# Patient Record
Sex: Female | Born: 1960 | Race: White | Hispanic: No | Marital: Married | Smoking: Former smoker
Health system: Southern US, Community
[De-identification: ages and names within clinical notes are randomized; demographics above are authoritative.]

## PROBLEM LIST (undated history)

## (undated) DIAGNOSIS — F419 Anxiety disorder, unspecified: Secondary | ICD-10-CM

## (undated) DIAGNOSIS — I10 Essential (primary) hypertension: Secondary | ICD-10-CM

## (undated) DIAGNOSIS — E785 Hyperlipidemia, unspecified: Secondary | ICD-10-CM

## (undated) DIAGNOSIS — I129 Hypertensive chronic kidney disease with stage 1 through stage 4 chronic kidney disease, or unspecified chronic kidney disease: Secondary | ICD-10-CM

## (undated) DIAGNOSIS — N049 Nephrotic syndrome with unspecified morphologic changes: Secondary | ICD-10-CM

## (undated) DIAGNOSIS — F32A Depression, unspecified: Secondary | ICD-10-CM

## (undated) DIAGNOSIS — N289 Disorder of kidney and ureter, unspecified: Secondary | ICD-10-CM

## (undated) DIAGNOSIS — F329 Major depressive disorder, single episode, unspecified: Secondary | ICD-10-CM

## (undated) HISTORY — DX: Nephrotic syndrome with unspecified morphologic changes: N04.9

## (undated) HISTORY — DX: Hypertensive chronic kidney disease with stage 1 through stage 4 chronic kidney disease, or unspecified chronic kidney disease: I12.9

## (undated) HISTORY — DX: Anxiety disorder, unspecified: F41.9

## (undated) HISTORY — DX: Depression, unspecified: F32.A

## (undated) HISTORY — DX: Major depressive disorder, single episode, unspecified: F32.9

## (undated) HISTORY — DX: Hyperlipidemia, unspecified: E78.5

---

## 1991-07-06 HISTORY — PX: TUBAL LIGATION: SHX77

## 2001-04-19 ENCOUNTER — Emergency Department (HOSPITAL_COMMUNITY): Admission: EM | Admit: 2001-04-19 | Discharge: 2001-04-19 | Payer: Self-pay | Admitting: Emergency Medicine

## 2001-07-19 ENCOUNTER — Ambulatory Visit (HOSPITAL_COMMUNITY): Admission: RE | Admit: 2001-07-19 | Discharge: 2001-07-19 | Payer: Self-pay | Admitting: Specialist

## 2001-07-19 ENCOUNTER — Encounter: Payer: Self-pay | Admitting: Specialist

## 2001-08-03 ENCOUNTER — Other Ambulatory Visit: Admission: RE | Admit: 2001-08-03 | Discharge: 2001-08-03 | Payer: Self-pay | Admitting: Obstetrics and Gynecology

## 2002-07-19 ENCOUNTER — Ambulatory Visit (HOSPITAL_COMMUNITY): Admission: RE | Admit: 2002-07-19 | Discharge: 2002-07-19 | Payer: Self-pay | Admitting: Specialist

## 2002-07-19 ENCOUNTER — Encounter: Payer: Self-pay | Admitting: Specialist

## 2003-08-08 ENCOUNTER — Ambulatory Visit (HOSPITAL_COMMUNITY): Admission: RE | Admit: 2003-08-08 | Discharge: 2003-08-08 | Payer: Self-pay | Admitting: Specialist

## 2004-09-28 ENCOUNTER — Ambulatory Visit (HOSPITAL_COMMUNITY): Admission: RE | Admit: 2004-09-28 | Discharge: 2004-09-28 | Payer: Self-pay | Admitting: Obstetrics and Gynecology

## 2004-10-02 ENCOUNTER — Ambulatory Visit (HOSPITAL_COMMUNITY): Admission: RE | Admit: 2004-10-02 | Discharge: 2004-10-02 | Payer: Self-pay | Admitting: Obstetrics and Gynecology

## 2005-10-04 ENCOUNTER — Ambulatory Visit (HOSPITAL_COMMUNITY): Admission: RE | Admit: 2005-10-04 | Discharge: 2005-10-04 | Payer: Self-pay | Admitting: Family Medicine

## 2007-02-06 ENCOUNTER — Ambulatory Visit (HOSPITAL_COMMUNITY): Admission: RE | Admit: 2007-02-06 | Discharge: 2007-02-06 | Payer: Self-pay | Admitting: Obstetrics and Gynecology

## 2007-05-18 ENCOUNTER — Other Ambulatory Visit: Admission: RE | Admit: 2007-05-18 | Discharge: 2007-05-18 | Payer: Self-pay | Admitting: Obstetrics and Gynecology

## 2008-09-04 ENCOUNTER — Other Ambulatory Visit: Admission: RE | Admit: 2008-09-04 | Discharge: 2008-09-04 | Payer: Self-pay | Admitting: Obstetrics and Gynecology

## 2009-11-12 ENCOUNTER — Other Ambulatory Visit: Admission: RE | Admit: 2009-11-12 | Discharge: 2009-11-12 | Payer: Self-pay | Admitting: Obstetrics and Gynecology

## 2010-02-09 ENCOUNTER — Ambulatory Visit (HOSPITAL_COMMUNITY): Admission: RE | Admit: 2010-02-09 | Discharge: 2010-02-09 | Payer: Self-pay | Admitting: Obstetrics & Gynecology

## 2010-02-21 ENCOUNTER — Emergency Department (HOSPITAL_COMMUNITY): Admission: EM | Admit: 2010-02-21 | Discharge: 2010-02-22 | Payer: Self-pay | Admitting: Emergency Medicine

## 2010-03-16 ENCOUNTER — Emergency Department (HOSPITAL_COMMUNITY): Admission: EM | Admit: 2010-03-16 | Discharge: 2010-03-17 | Payer: Self-pay | Admitting: Emergency Medicine

## 2010-03-27 ENCOUNTER — Ambulatory Visit (HOSPITAL_COMMUNITY): Admission: RE | Admit: 2010-03-27 | Discharge: 2010-03-27 | Payer: Self-pay | Admitting: Nephrology

## 2010-06-15 ENCOUNTER — Ambulatory Visit (HOSPITAL_COMMUNITY)
Admission: RE | Admit: 2010-06-15 | Discharge: 2010-06-15 | Payer: Self-pay | Source: Home / Self Care | Attending: Obstetrics & Gynecology | Admitting: Obstetrics & Gynecology

## 2010-07-25 ENCOUNTER — Encounter: Payer: Self-pay | Admitting: Specialist

## 2010-09-17 LAB — COMPREHENSIVE METABOLIC PANEL
ALT: 16 U/L (ref 0–35)
ALT: 12 U/L (ref 0–35)
AST: 21 U/L (ref 0–37)
Albumin: 2.3 g/dL — ABNORMAL LOW (ref 3.5–5.2)
Albumin: 2.1 g/dL — ABNORMAL LOW (ref 3.5–5.2)
Alkaline Phosphatase: 80 U/L (ref 39–117)
BUN: 18 mg/dL (ref 6–23)
CO2: 28 mEq/L (ref 19–32)
CO2: 28 mEq/L (ref 19–32)
Calcium: 8.5 mg/dL (ref 8.4–10.5)
Calcium: 8.9 mg/dL (ref 8.4–10.5)
Chloride: 106 mEq/L (ref 96–112)
Chloride: 107 mEq/L (ref 96–112)
Creatinine, Ser: 0.84 mg/dL (ref 0.4–1.2)
GFR calc Af Amer: >60 mL/min (ref >60)
GFR calc non Af Amer: >60 mL/min (ref >60)
GFR calc non Af Amer: >60 mL/min (ref >60)
Glucose, Bld: 95 mg/dL (ref 70–99)
Potassium: 4.1 mEq/L (ref 3.5–5.1)
Potassium: 3.9 mEq/L (ref 3.5–5.1)
Sodium: 139 mEq/L (ref 135–145)
Total Bilirubin: 0.4 mg/dL (ref 0.3–1.2)
Total Protein: 5.5 g/dL — ABNORMAL LOW (ref 6.0–8.3)
Total Protein: 5.2 g/dL — ABNORMAL LOW (ref 6.0–8.3)

## 2010-09-17 LAB — CBC
HCT: 41.6 % (ref 36.0–46.0)
HCT: 37 % (ref 36.0–46.0)
HCT: 36 % (ref 36.0–46.0)
Hemoglobin: 12.1 g/dL (ref 12.0–15.0)
MCH: 30.9 pg (ref 26.0–34.0)
MCH: 31 pg (ref 26.0–34.0)
MCHC: 33.6 g/dL (ref 30.0–36.0)
Platelets: 251 10*3/uL (ref 150–400)
RBC: 3.9 MIL/uL (ref 3.87–5.11)
RDW: 12.3 % (ref 11.5–15.5)
RDW: 12.5 % (ref 11.5–15.5)
WBC: 10.4 10*3/uL (ref 4.0–10.5)

## 2010-09-17 LAB — URINE CULTURE
Colony Count: NO GROWTH
Culture  Setup Time: 201108211315

## 2010-09-17 LAB — URINALYSIS, ROUTINE W REFLEX MICROSCOPIC
Bilirubin Urine: NEGATIVE
Bilirubin Urine: NEGATIVE
Glucose, UA: NEGATIVE mg/dL
Glucose, UA: NEGATIVE mg/dL
Ketones, ur: NEGATIVE mg/dL
Ketones, ur: NEGATIVE mg/dL
Nitrite: NEGATIVE
Protein, ur: >300 mg/dL — AB
Urobilinogen, UA: 0.2 mg/dL (ref 0.0–1.0)

## 2010-09-17 LAB — DIFFERENTIAL
Basophils Absolute: 0.1 10*3/uL (ref 0.0–0.1)
Eosinophils Absolute: 0.6 10*3/uL (ref 0.0–0.7)
Eosinophils Relative: 6 % — ABNORMAL HIGH (ref 0–5)
Monocytes Absolute: 0.7 10*3/uL (ref 0.1–1.0)
Neutro Abs: 5.9 10*3/uL (ref 1.7–7.7)

## 2010-09-17 LAB — PROTIME-INR: INR: 0.89 (ref 0.00–1.49)

## 2010-09-17 LAB — URINE MICROSCOPIC-ADD ON

## 2011-01-05 ENCOUNTER — Other Ambulatory Visit (HOSPITAL_COMMUNITY)
Admission: RE | Admit: 2011-01-05 | Discharge: 2011-01-05 | Disposition: A | Discharge status: Home / Self Care | Payer: Managed Care, Other (non HMO) | Source: Ambulatory Visit | Attending: Obstetrics and Gynecology | Admitting: Obstetrics and Gynecology

## 2011-01-05 ENCOUNTER — Other Ambulatory Visit: Payer: Self-pay | Admitting: Adult Health

## 2011-01-05 DIAGNOSIS — Z01419 Encounter for gynecological examination (general) (routine) without abnormal findings: Secondary | ICD-10-CM | POA: Insufficient documentation

## 2011-04-05 ENCOUNTER — Other Ambulatory Visit: Payer: Self-pay | Admitting: Adult Health

## 2011-04-05 DIAGNOSIS — Z139 Encounter for screening, unspecified: Secondary | ICD-10-CM

## 2011-06-21 ENCOUNTER — Ambulatory Visit (HOSPITAL_COMMUNITY): Admission: RE | Admit: 2011-06-21 | Payer: Managed Care, Other (non HMO) | Source: Ambulatory Visit

## 2011-07-02 ENCOUNTER — Emergency Department (HOSPITAL_COMMUNITY)
Admission: EM | Admit: 2011-07-02 | Discharge: 2011-07-02 | Disposition: A | Discharge status: Home / Self Care | Payer: Managed Care, Other (non HMO) | Attending: Emergency Medicine | Admitting: Emergency Medicine

## 2011-07-02 ENCOUNTER — Other Ambulatory Visit: Payer: Self-pay

## 2011-07-02 ENCOUNTER — Emergency Department (HOSPITAL_COMMUNITY): Payer: Managed Care, Other (non HMO)

## 2011-07-02 DIAGNOSIS — I129 Hypertensive chronic kidney disease with stage 1 through stage 4 chronic kidney disease, or unspecified chronic kidney disease: Secondary | ICD-10-CM | POA: Insufficient documentation

## 2011-07-02 DIAGNOSIS — N189 Chronic kidney disease, unspecified: Secondary | ICD-10-CM | POA: Insufficient documentation

## 2011-07-02 DIAGNOSIS — R509 Fever, unspecified: Secondary | ICD-10-CM | POA: Insufficient documentation

## 2011-07-02 DIAGNOSIS — Z79899 Other long term (current) drug therapy: Secondary | ICD-10-CM | POA: Insufficient documentation

## 2011-07-02 DIAGNOSIS — R112 Nausea with vomiting, unspecified: Secondary | ICD-10-CM | POA: Insufficient documentation

## 2011-07-02 DIAGNOSIS — R0602 Shortness of breath: Secondary | ICD-10-CM | POA: Insufficient documentation

## 2011-07-02 DIAGNOSIS — R0682 Tachypnea, not elsewhere classified: Secondary | ICD-10-CM | POA: Insufficient documentation

## 2011-07-02 DIAGNOSIS — J40 Bronchitis, not specified as acute or chronic: Secondary | ICD-10-CM

## 2011-07-02 DIAGNOSIS — Z87891 Personal history of nicotine dependence: Secondary | ICD-10-CM | POA: Insufficient documentation

## 2011-07-02 HISTORY — DX: Essential (primary) hypertension: I10

## 2011-07-02 HISTORY — DX: Disorder of kidney and ureter, unspecified: N28.9

## 2011-07-02 MED ORDER — ALBUTEROL SULFATE (5 MG/ML) 0.5% IN NEBU
5.0000 mg | INHALATION_SOLUTION | Freq: Once | RESPIRATORY_TRACT | Status: AC
  Administered 2011-07-02: 5 mg via RESPIRATORY_TRACT
  Filled 2011-07-02 (×2): qty 0.5

## 2011-07-02 MED ORDER — IPRATROPIUM BROMIDE 0.02 % IN SOLN
0.5000 mg | Freq: Once | RESPIRATORY_TRACT | Status: AC
  Administered 2011-07-02: 0.5 mg via RESPIRATORY_TRACT
  Filled 2011-07-02: qty 2.5

## 2011-07-02 MED ORDER — AEROCHAMBER PLUS W/MASK MISC
Status: AC
  Filled 2011-07-02: qty 1

## 2011-07-02 MED ORDER — PREDNISONE 20 MG PO TABS: 40.0000 mg | ORAL_TABLET | Freq: Every day | ORAL | Status: AC

## 2011-07-02 MED ORDER — PREDNISONE 20 MG PO TABS
60.0000 mg | ORAL_TABLET | Freq: Once | ORAL | Status: AC
  Administered 2011-07-02: 60 mg via ORAL
  Filled 2011-07-02: qty 3

## 2011-07-02 MED ORDER — ALBUTEROL SULFATE HFA 108 (90 BASE) MCG/ACT IN AERS
1.0000 | INHALATION_SPRAY | Freq: Four times a day (QID) | RESPIRATORY_TRACT | Status: DC | PRN
  Filled 2011-07-02: qty 6.7

## 2011-07-02 MED ORDER — ALBUTEROL SULFATE (5 MG/ML) 0.5% IN NEBU
5.0000 mg | INHALATION_SOLUTION | Freq: Once | RESPIRATORY_TRACT | Status: AC
  Administered 2011-07-02: 5 mg via RESPIRATORY_TRACT
  Filled 2011-07-02: qty 1

## 2011-07-02 NOTE — ED Notes (Signed)
EDP at bedside  

## 2011-07-02 NOTE — Discharge Instructions (Signed)
Bronchitis Bronchitis is the body's way of reacting to injury and/or infection (inflammation) of the bronchi. Bronchi are the air tubes that extend from the windpipe into the lungs. If the inflammation becomes severe, it may cause shortness of breath. CAUSES  Inflammation may be caused by:  A virus.   Germs (bacteria).   Dust.   Allergens.   Pollutants and many other irritants.  The cells lining the bronchial tree are covered with tiny hairs (cilia). These constantly beat upward, away from the lungs, toward the mouth. This keeps the lungs free of pollutants. When these cells become too irritated and are unable to do their job, mucus begins to develop. This causes the characteristic cough of bronchitis. The cough clears the lungs when the cilia are unable to do their job. Without either of these protective mechanisms, the mucus would settle in the lungs. Then you would develop pneumonia. Smoking is a common cause of bronchitis and can contribute to pneumonia. Stopping this habit is the single most important thing you can do to help yourself. TREATMENT   Your caregiver may prescribe an antibiotic if the cough is caused by bacteria. Also, medicines that open up your airways make it easier to breathe. Your caregiver may also recommend or prescribe an expectorant. It will loosen the mucus to be coughed up. Only take over-the-counter or prescription medicines for pain, discomfort, or fever as directed by your caregiver.   Removing whatever causes the problem (smoking, for example) is critical to preventing the problem from getting worse.   Cough suppressants may be prescribed for relief of cough symptoms.   Inhaled medicines may be prescribed to help with symptoms now and to help prevent problems from returning.   For those with recurrent (chronic) bronchitis, there may be a need for steroid medicines.  SEEK IMMEDIATE MEDICAL CARE IF:   During treatment, you develop more pus-like mucus  (purulent sputum).   You have a fever.   You become progressively more ill.   You have increased difficulty breathing, wheezing, or shortness of breath.  It is necessary to seek immediate medical care if you are elderly or sick from any other disease. MAKE SURE YOU:   Understand these instructions.   Will watch your condition.   Will get help right away if you are not doing well or get worse.  Document Released: 06/21/2005 Document Revised: 03/03/2011 Document Reviewed: 04/30/2008 Exit[employer] Patient Information 2012 [employer]. 

## 2011-07-02 NOTE — ED Notes (Signed)
Patient resting with NAD at this time. Patient states she feel better and is breathing better. Family at bedside.

## 2011-07-02 NOTE — ED Notes (Signed)
Patient states that she has had SOB x 2 days and non-productive coughing x 2 weeks.  Patient denies fever, n/v until arrival to ED and patient vomited on arrival.

## 2011-07-02 NOTE — ED Notes (Signed)
ekg given to dr. ghim 

## 2011-07-02 NOTE — ED Notes (Signed)
Patient back from x-ray 

## 2011-07-02 NOTE — ED Notes (Signed)
Pt. Has had flu-like symptoms for 1 week, progressivley becoming  Worse.  Cough congestion sob, n/v, fever and chills

## 2011-07-02 NOTE — ED Provider Notes (Signed)
History     CSN: [id number]  Arrival date & time 07/02/11  1138   First MD Initiated Contact with Patient 07/02/11 1153      Chief Complaint  Patient presents with  . Shortness of Breath    (Consider location/radiation/quality/duration/timing/severity/associated sxs/prior treatment) HPI Comments: Pt with cough worsening over 1 week.  Subjective fevers at home.  Pt reports severe SOB today and thus had to come to ED.  Pt is former smoker for 35 years, quit 2 years ago.  No h/o bronchitis, asthma, pneumonia or COPD.  Dr. Regino Pineda is PCP.  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 Kathrene Pineda but is now improved.  She has not used inhalers or nebulizers in the past.  She has not seen a physician for her current symptoms.  Did not have flu vaccine this year.  She had 1 episode of N/V just with arrival, but nausea is now resolved.  Denies sig myalgias or sore throat or rhinorrhea.  Denies any CP.    Patient is a 50 y.o. female presenting with shortness of breath. The history is provided by the patient, the spouse and a relative.  Shortness of Breath  Associated symptoms include a fever, cough and shortness of breath. Pertinent negatives include no chest pain and no rhinorrhea.    Past Medical History  Diagnosis Date  . Hypertension     HTN related to a kidney disorder  . Renal disorder     History reviewed. No pertinent past surgical history.  History reviewed. No pertinent family history.  History  Substance Use Topics  . Smoking status: Former Games developer  . Smokeless tobacco: Not on file  . Alcohol Use: No    OB History    Grav Para Term Preterm Abortions TAB SAB Ect Mult Living                  Review of Systems  Constitutional: Positive for fever.  HENT: Negative for rhinorrhea.   Respiratory: Positive for cough and shortness of breath. Negative for chest tightness.   Cardiovascular: Negative for chest pain.   Gastrointestinal: Positive for nausea and vomiting. Negative for abdominal pain.  Musculoskeletal: Negative for myalgias and back pain.  Skin: Negative for rash.  Neurological: Negative for headaches.  All other systems reviewed and are negative.    Allergies  Review of patient's allergies indicates no known allergies.  Home Medications   Current Outpatient Rx  Name Route Sig Dispense Refill  . BENZONATATE 100 MG PO CAPS Oral Take 100 mg by mouth 3 (three) times daily as needed. For cough     . CORICIDIN HBP COUGH/COLD PO Oral Take 1 tablet by mouth daily as needed.      Marland Kitchen CITALOPRAM HYDROBROMIDE 20 MG PO TABS Oral Take 20 mg by mouth daily.      . FUROSEMIDE 80 MG PO TABS Oral Take 80 mg by mouth daily.      Marland Kitchen HYDROCODONE-HOMATROPINE 5-1.5 MG/5ML PO SYRP Oral Take 5-10 mLs by mouth every 6 (six) hours as needed. For cough     . NEBIVOLOL HCL 5 MG PO TABS Oral Take 5 mg by mouth daily.      Marland Kitchen SIMVASTATIN 20 MG PO TABS Oral Take 20 mg by mouth at bedtime.      Marland Kitchen PREDNISONE 20 MG PO TABS Oral Take 2 tablets (40 mg total) by mouth daily. 10 tablet 0  BP 133/62  Pulse 86  Temp(Src) 97.6 F (36.4 C) (Oral)  Resp 24  Ht 5\' 2"  (1.575 m)  Wt 140 lb (63.504 kg)  BMI 25.61 kg/m2  SpO2 93%  Physical Exam  Nursing note and vitals reviewed. Constitutional: She is oriented to person, place, and time. She appears well-developed and well-nourished.  HENT:  Head: Normocephalic.  Eyes: Pupils are equal, round, and reactive to light.  Neck: Normal range of motion. Neck supple.  Cardiovascular: Normal rate.   Pulmonary/Chest: Tachypnea noted. No respiratory distress. She has no decreased breath sounds. She has wheezes in the right upper field, the right middle field, the left upper field, the left middle field and the left lower field. She has no rhonchi. She has no rales.  Abdominal: Soft. She exhibits no distension. There is no tenderness.  Musculoskeletal: Normal range of motion.    Neurological: She is alert and oriented to person, place, and time.  Skin: Skin is warm and dry. No rash noted.  Psychiatric: She has a normal mood and affect.    ED Course  Procedures (including critical care time)  Labs Reviewed - No data to display Dg Chest 2 View  07/02/2011  *RADIOLOGY REPORT*  Clinical Data: Cough.  Fever.  Shortness of breath.  CHEST - 2 VIEW  Comparison: 03/16/2010  Findings: Heart size is normal.  Mediastinal shadows are normal. There are chronic interstitial lung markings.  There is bronchial thickening.  No infiltrate, collapse or effusion.  No bony abnormality.  IMPRESSION: Bronchitis.  Chronic interstitial lung markings.  No consolidation or collapse.  Original Report Authenticated By: Thomasenia Sales, M.D.    I reviewed the above CXR.  1. Bronchitis     RA sats were 93-94% which may be low normal or slightly low.   MDM  Initial elevated HR is resolved to normal now.  I suspect was high due to stress, anxiety and possibly while still nauseated.  Diffuse wheezing, probably viral bronchitis.  Beta agonists, oral steroids, will get CXR to exclude pneumonia.  Will continue to assess and monitor breathing status.      ECG at time 12:14 PM shows sinus rhythm with sinus arrythmia at rate 82.  Normal axis, no ST or T wave changes.  Similar in appearance to ECG dated 06/15/10.     1:38 PM Pt reports feeling much improved after first treatment. RA sats are now 96-97%.  No nausea.  Will give inhaler for home, prescription for steroids and encourage close follow up with Dr. Regino Pineda.   2:48 PM Pt with marked decrease in wheezing, sounds much improved.  RA sats are now 98%  Cassandra Pineda. Cassandra Lamas, MD 07/02/11 [phone]

## 2011-07-02 NOTE — ED Notes (Signed)
RT called for breathing tx. 

## 2011-07-02 NOTE — ED Notes (Signed)
Patient states she has been SOB x 2 days and coughing for x 2 week. Patient denies any fever, N/V until she arrived at the ED and she has had one episode of vomiting.  Patient denies any chest pain or general pain. Patient's daughter states that her mother has been taking her dad's cough medication.

## 2011-07-06 HISTORY — PX: RENAL BIOPSY: SHX156

## 2011-08-23 ENCOUNTER — Ambulatory Visit (HOSPITAL_COMMUNITY)
Admission: RE | Admit: 2011-08-23 | Discharge: 2011-08-23 | Disposition: A | Discharge status: Home / Self Care | Payer: Managed Care, Other (non HMO) | Source: Ambulatory Visit | Attending: Adult Health | Admitting: Adult Health

## 2011-08-23 DIAGNOSIS — Z139 Encounter for screening, unspecified: Secondary | ICD-10-CM

## 2011-08-23 DIAGNOSIS — Z1231 Encounter for screening mammogram for malignant neoplasm of breast: Secondary | ICD-10-CM | POA: Insufficient documentation

## 2011-10-03 IMAGING — US US ABDOMEN COMPLETE
1 series · 14 of 25 positions shown · non-contrast
Comparison: None

CLINICAL DATA: Bloating, swelling, proteinuria

ULTRASOUND ABDOMEN:
TECHNIQUE: Sonography of upper abdominal structures was performed.

[Series 1: us abdomen complete · 0.26mm/px · 14 of 92 slices shown]
[im 1/92]
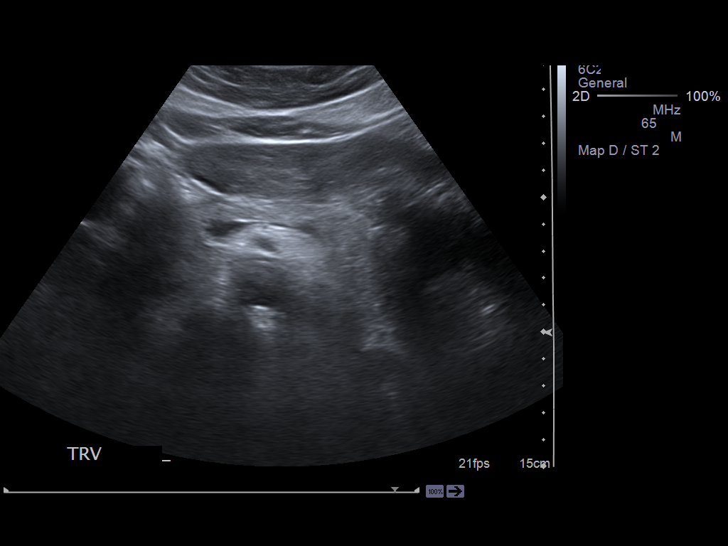
[im 8/92]
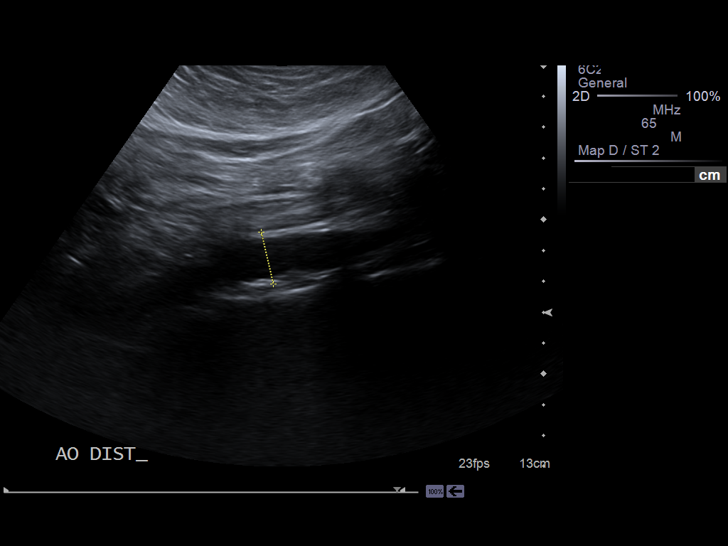
[im 16/92]
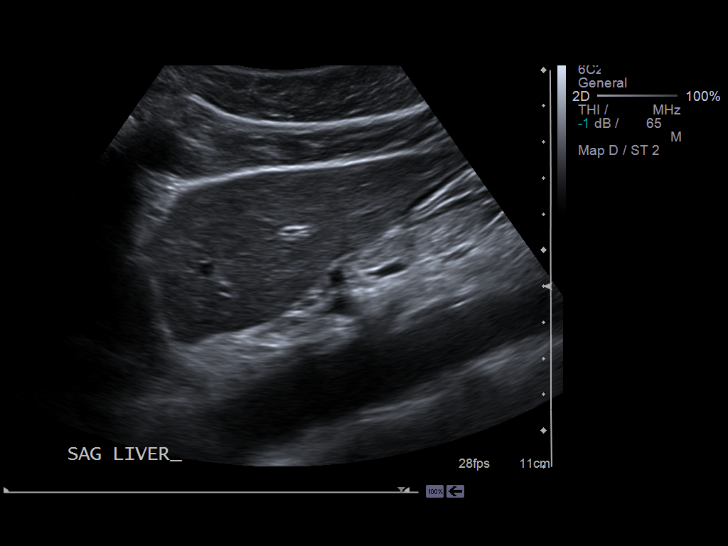
[im 23/92]
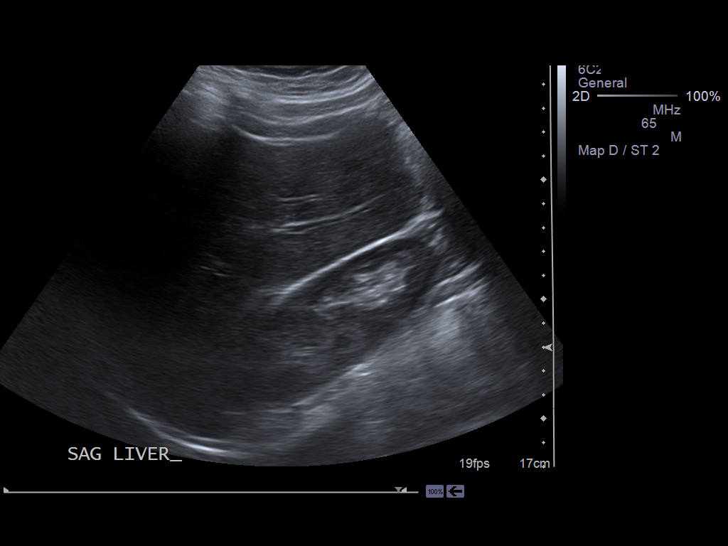
[im 31/92]
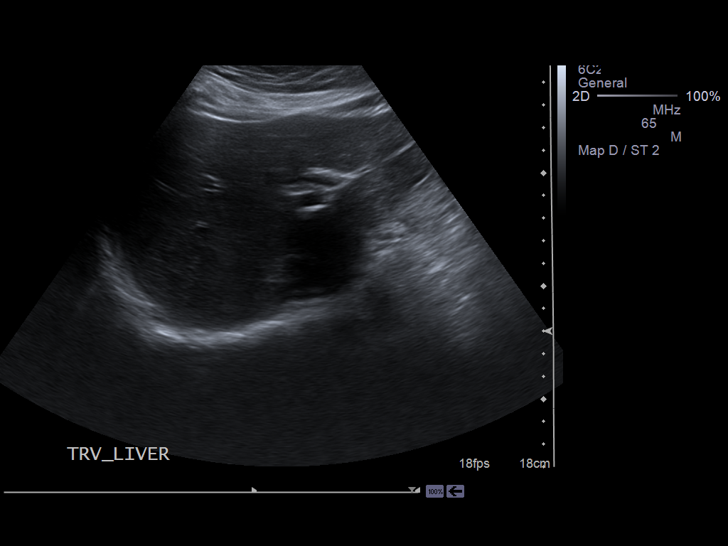
[im 35/92]
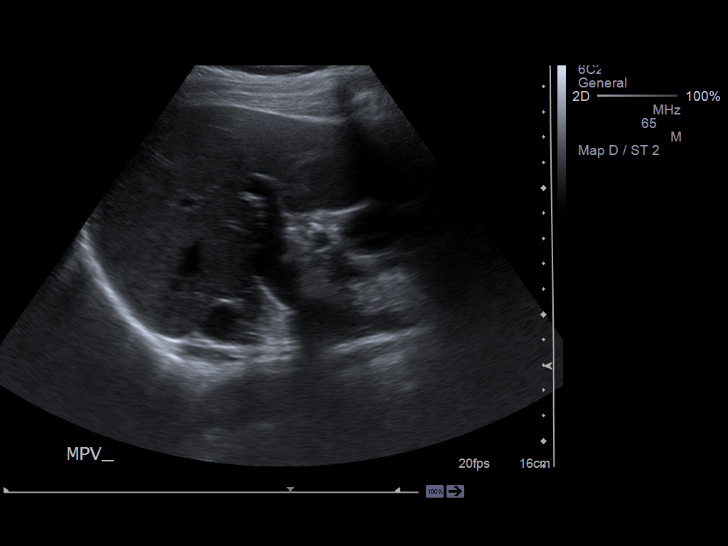
[im 42/92]
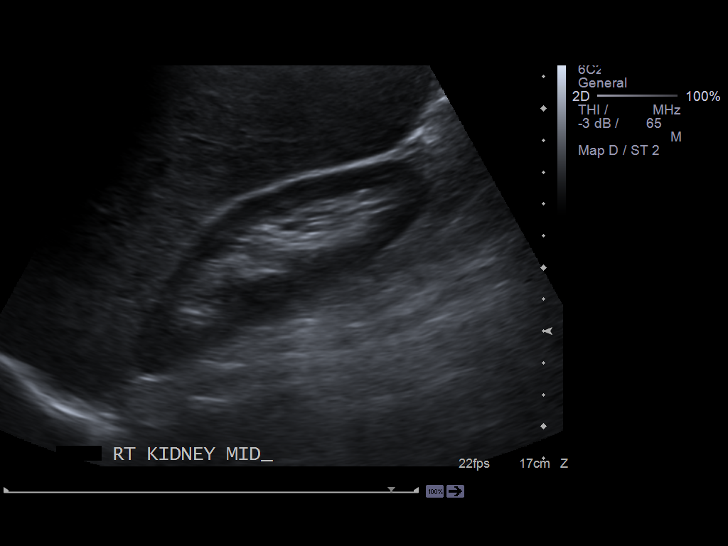
[im 50/92]
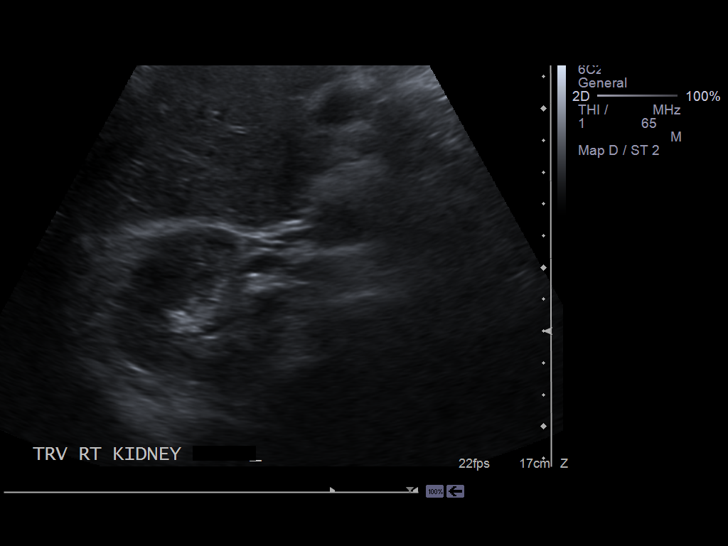
[im 57/92]
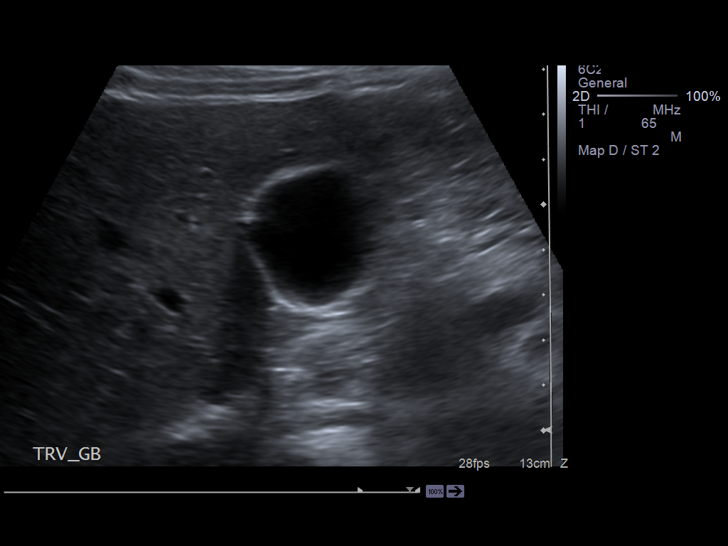
[im 61/92]
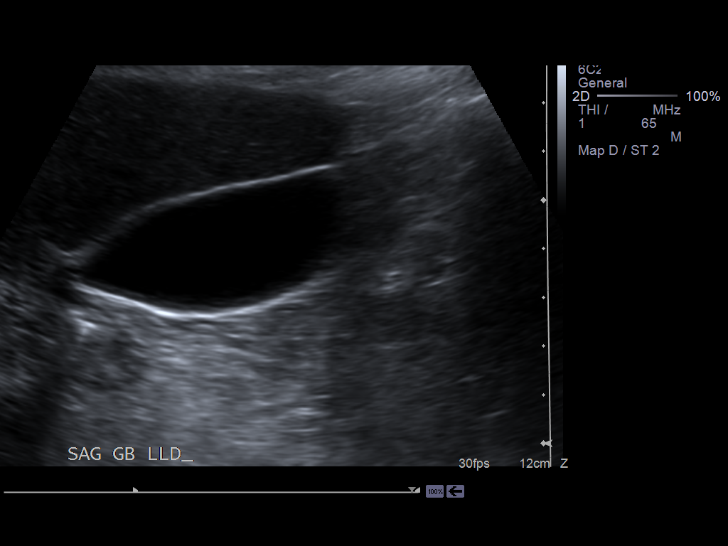
[im 69/92]
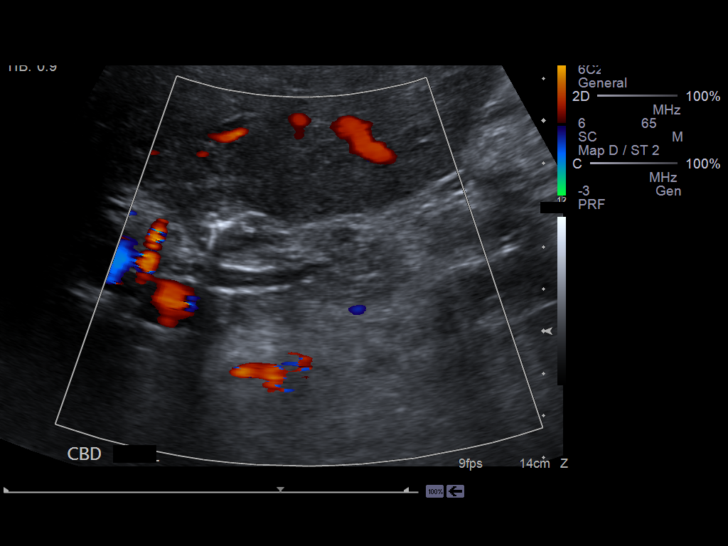
[im 76/92]
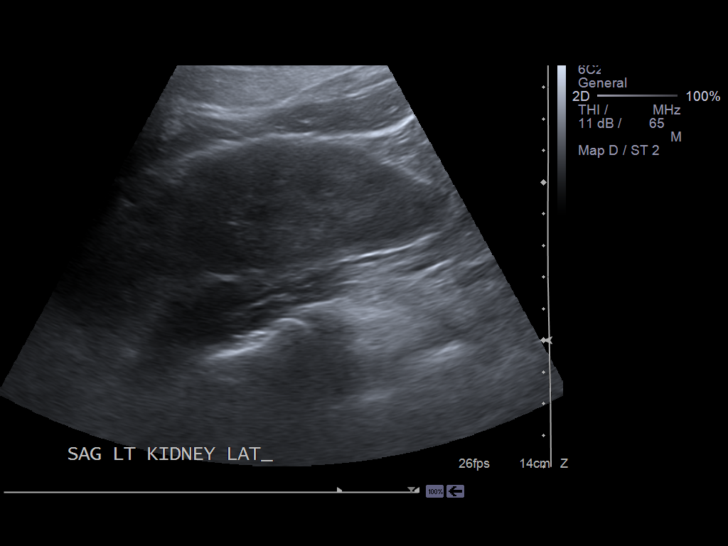
[im 84/92]
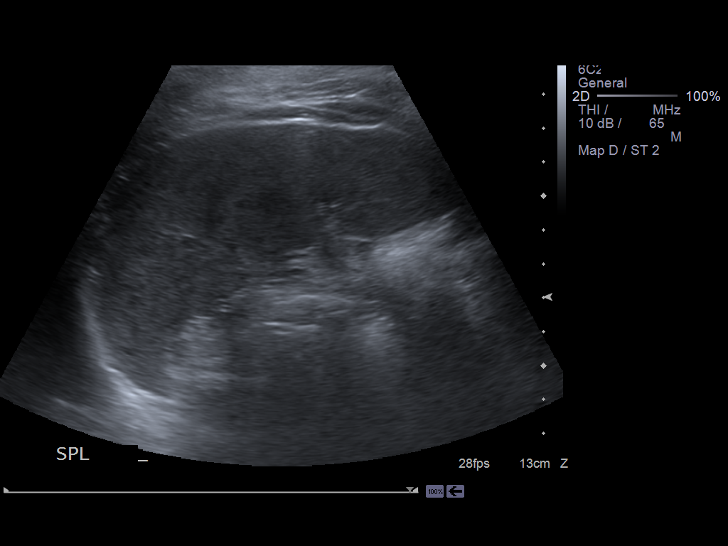
[im 92/92]
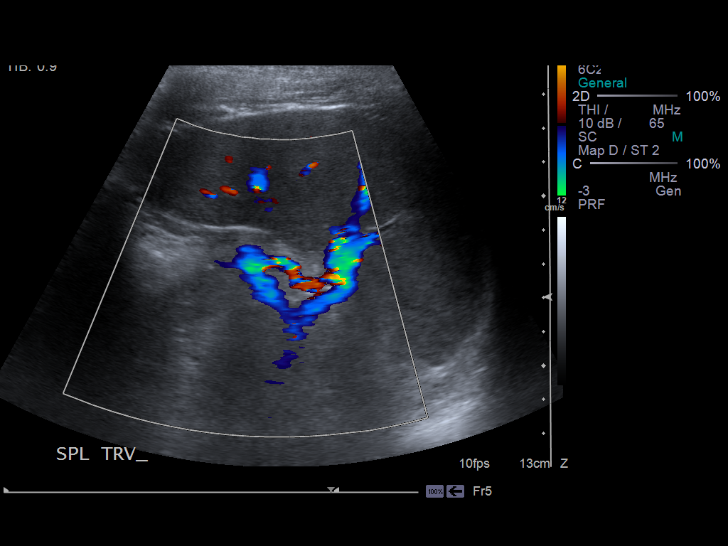

[14 of 25 positions shown; findings below may reference images not displayed]

Gallbladder:  Normally distended without stones or wall thickening.
No pericholecystic fluid or sonographic Murphy sign.

Common bile duct:  4 mm diameter, normal

Liver:  Normal appearance

IVC:  Unremarkable

Pancreas:  Normal appearance

Spleen:  Normal appearance, 10.6 cm length

Right kidney:  11 cm length. Normal morphology without mass or
hydronephrosis.

Left kidney:  10.9 cm length. Normal morphology without mass or
hydronephrosis.

Aorta:  Unremarkable

Other:  No free fluid
IMPRESSION: No acute upper abdominal sonographic abnormalities.

## 2012-05-03 ENCOUNTER — Other Ambulatory Visit (HOSPITAL_COMMUNITY)
Admission: RE | Admit: 2012-05-03 | Discharge: 2012-05-03 | Disposition: A | Discharge status: Home / Self Care | Payer: Managed Care, Other (non HMO) | Source: Ambulatory Visit | Attending: Obstetrics and Gynecology | Admitting: Obstetrics and Gynecology

## 2012-05-03 ENCOUNTER — Other Ambulatory Visit: Payer: Self-pay | Admitting: Adult Health

## 2012-05-03 DIAGNOSIS — Z1151 Encounter for screening for human papillomavirus (HPV): Secondary | ICD-10-CM | POA: Insufficient documentation

## 2012-05-03 DIAGNOSIS — Z01419 Encounter for gynecological examination (general) (routine) without abnormal findings: Secondary | ICD-10-CM | POA: Insufficient documentation

## 2012-09-26 ENCOUNTER — Other Ambulatory Visit: Payer: Self-pay | Admitting: Adult Health

## 2012-09-26 DIAGNOSIS — IMO0001 Reserved for inherently not codable concepts without codable children: Secondary | ICD-10-CM

## 2012-10-09 ENCOUNTER — Ambulatory Visit (HOSPITAL_COMMUNITY)
Admission: RE | Admit: 2012-10-09 | Discharge: 2012-10-09 | Disposition: A | Discharge status: Home / Self Care | Payer: Managed Care, Other (non HMO) | Source: Ambulatory Visit | Attending: Adult Health | Admitting: Adult Health

## 2012-10-09 DIAGNOSIS — Z1231 Encounter for screening mammogram for malignant neoplasm of breast: Secondary | ICD-10-CM | POA: Insufficient documentation

## 2012-10-09 DIAGNOSIS — IMO0001 Reserved for inherently not codable concepts without codable children: Secondary | ICD-10-CM

## 2013-02-22 IMAGING — CR DG CHEST 2V
2 series · 2 of 2 positions shown · non-contrast
Comparison: 03/16/2010

CLINICAL DATA: Cough.  Fever.  Shortness of breath.

CHEST - 2 VIEW

[w chest pa]
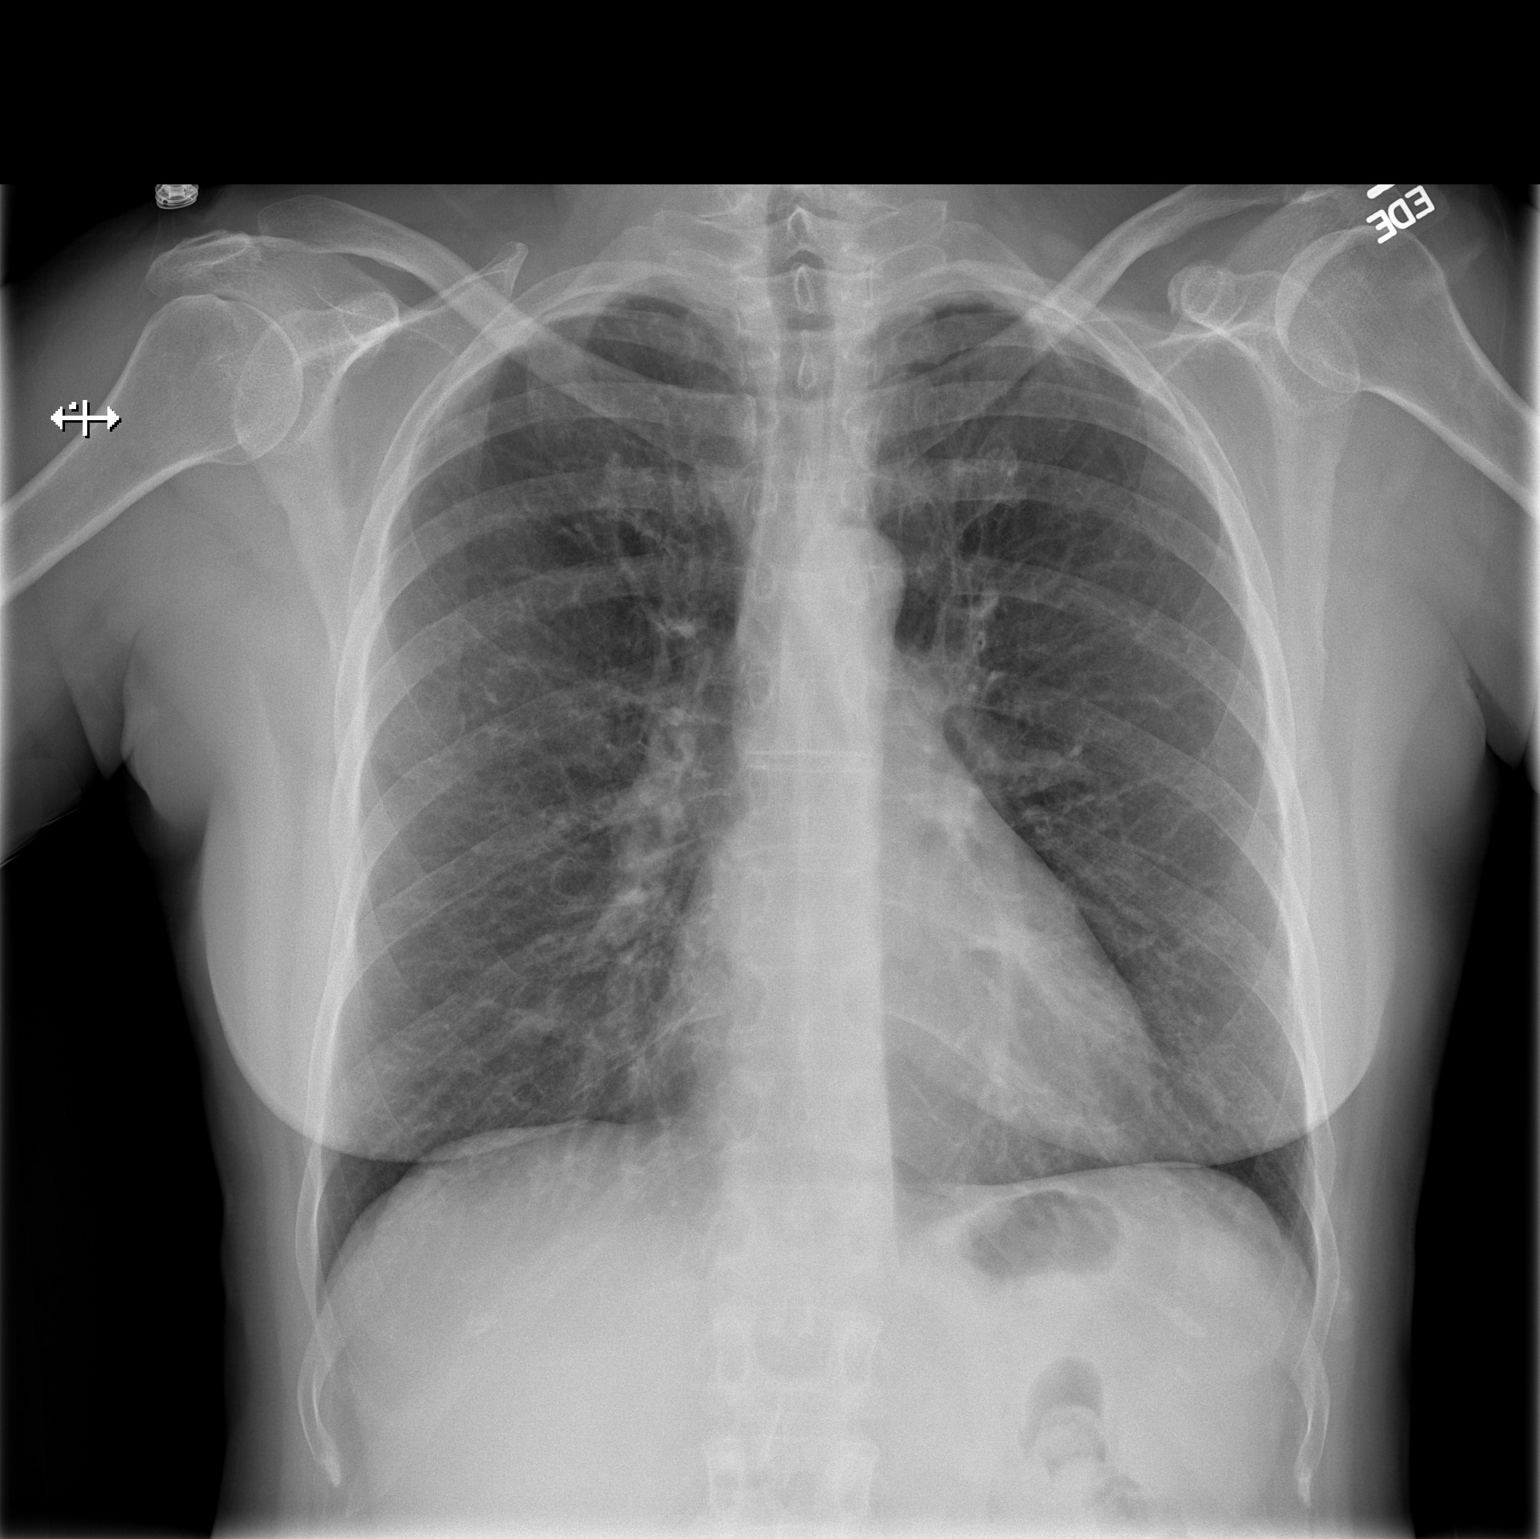

[w chest lat]
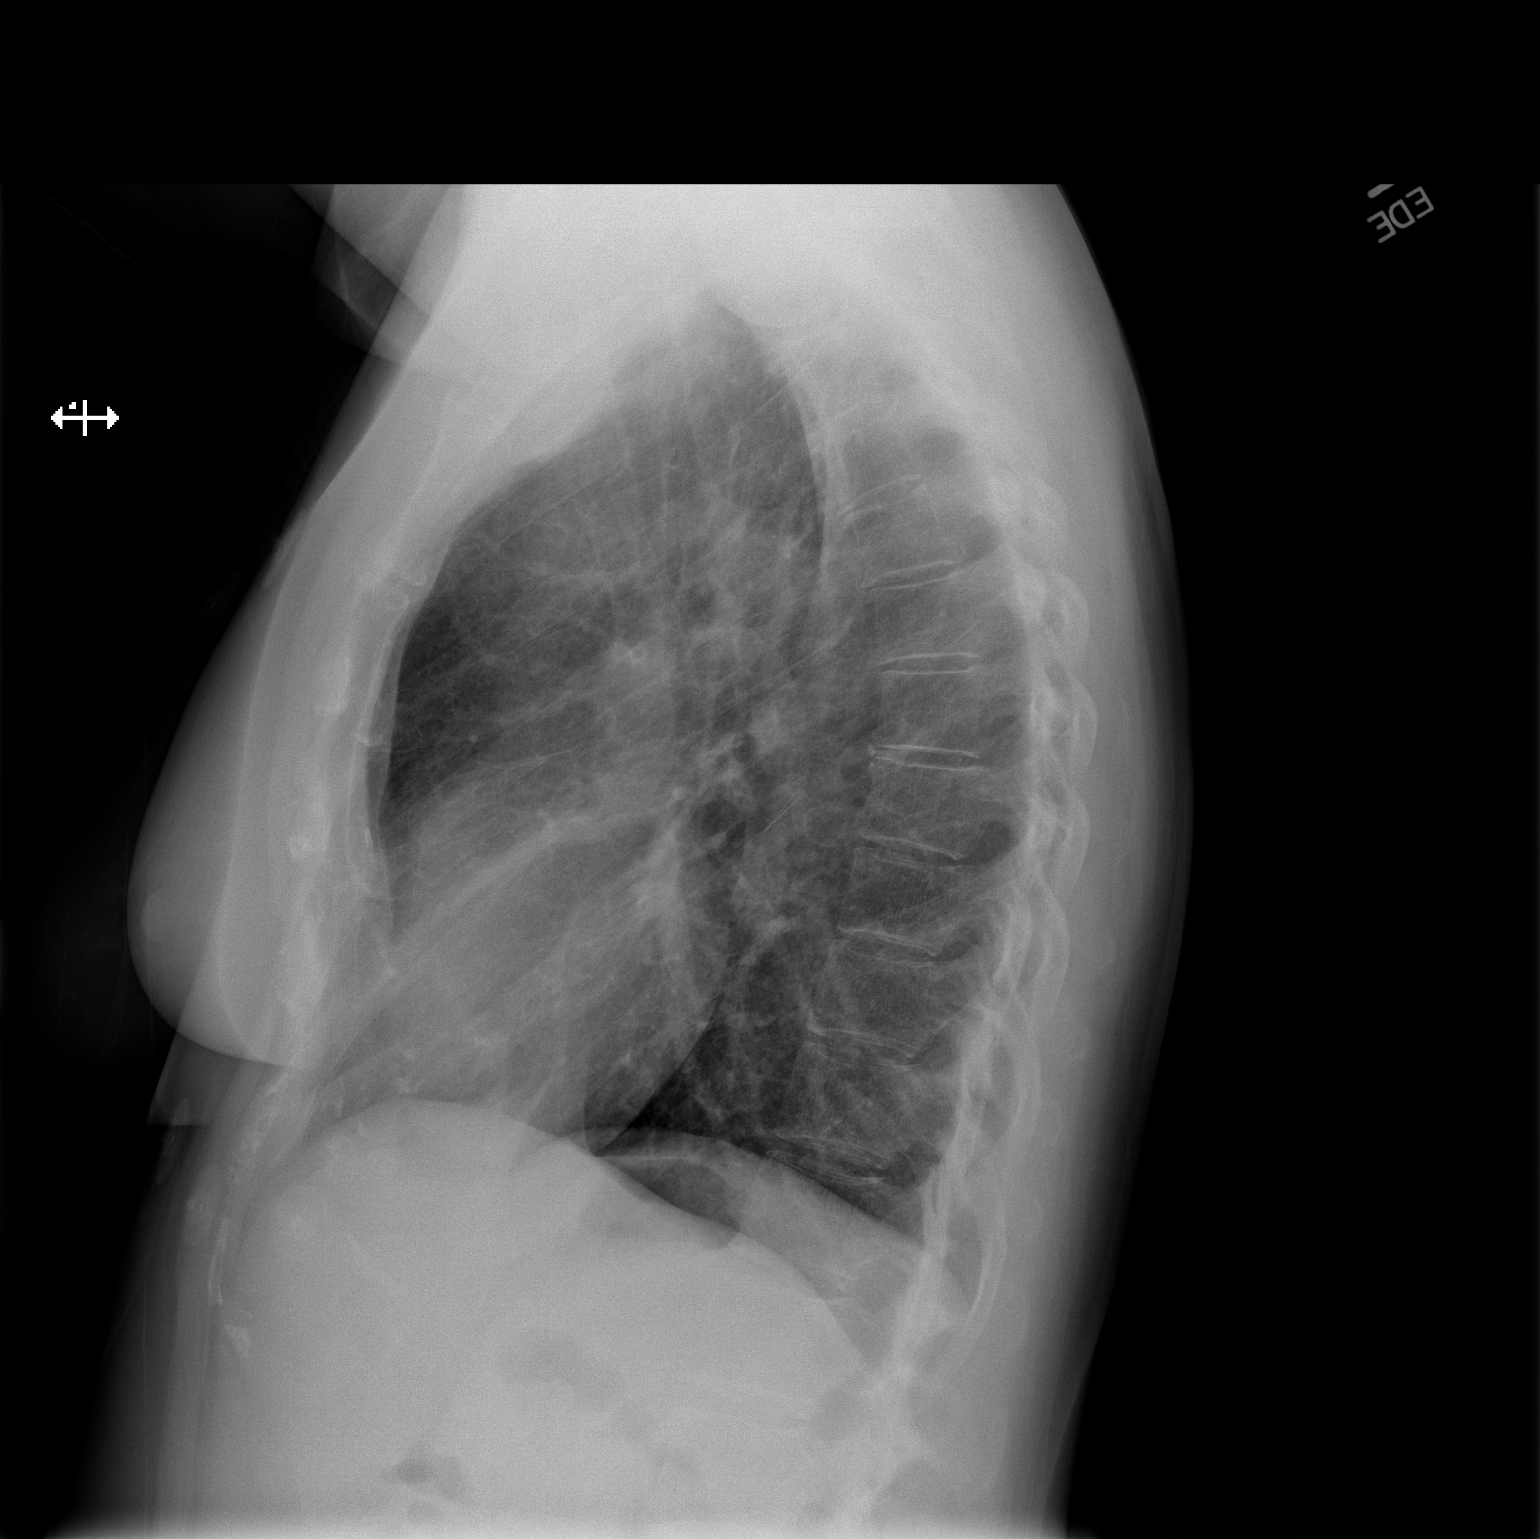

[2 of 2 positions shown; findings below may reference images not displayed]

FINDINGS: Heart size is normal.  Mediastinal shadows are normal.
There are chronic interstitial lung markings.  There is bronchial
thickening.  No infiltrate, collapse or effusion.  No bony
abnormality.
IMPRESSION: Bronchitis.  Chronic interstitial lung markings.  No consolidation
or collapse.

## 2013-06-15 ENCOUNTER — Encounter: Payer: Self-pay | Admitting: *Deleted

## 2013-06-18 ENCOUNTER — Encounter: Payer: Self-pay | Admitting: Internal Medicine

## 2013-06-18 ENCOUNTER — Ambulatory Visit (INDEPENDENT_AMBULATORY_CARE_PROVIDER_SITE_OTHER): Payer: Managed Care, Other (non HMO) | Admitting: Internal Medicine

## 2013-06-18 VITALS — BP 122/70 | HR 65 | Temp 97.9°F | Resp 14 | Ht 64.5 in | Wt 158.8 lb

## 2013-06-18 DIAGNOSIS — N049 Nephrotic syndrome with unspecified morphologic changes: Secondary | ICD-10-CM

## 2013-06-18 DIAGNOSIS — I129 Hypertensive chronic kidney disease with stage 1 through stage 4 chronic kidney disease, or unspecified chronic kidney disease: Secondary | ICD-10-CM | POA: Insufficient documentation

## 2013-06-18 DIAGNOSIS — E785 Hyperlipidemia, unspecified: Secondary | ICD-10-CM

## 2013-06-18 DIAGNOSIS — Z1211 Encounter for screening for malignant neoplasm of colon: Secondary | ICD-10-CM

## 2013-06-18 DIAGNOSIS — F329 Major depressive disorder, single episode, unspecified: Secondary | ICD-10-CM

## 2013-06-18 DIAGNOSIS — F3289 Other specified depressive episodes: Secondary | ICD-10-CM

## 2013-06-18 DIAGNOSIS — R0600 Dyspnea, unspecified: Secondary | ICD-10-CM

## 2013-06-18 DIAGNOSIS — N189 Chronic kidney disease, unspecified: Secondary | ICD-10-CM

## 2013-06-18 DIAGNOSIS — Z23 Encounter for immunization: Secondary | ICD-10-CM

## 2013-06-18 DIAGNOSIS — F32A Depression, unspecified: Secondary | ICD-10-CM

## 2013-06-18 DIAGNOSIS — R0609 Other forms of dyspnea: Secondary | ICD-10-CM

## 2013-06-18 DIAGNOSIS — F411 Generalized anxiety disorder: Secondary | ICD-10-CM

## 2013-06-18 DIAGNOSIS — F419 Anxiety disorder, unspecified: Secondary | ICD-10-CM

## 2013-06-18 MED ORDER — ALBUTEROL SULFATE HFA 108 (90 BASE) MCG/ACT IN AERS: 2.0000 | INHALATION_SPRAY | Freq: Four times a day (QID) | RESPIRATORY_TRACT | Status: DC | PRN

## 2013-06-18 NOTE — Progress Notes (Signed)
Patient ID: Cassandra Pineda, female   DOB: [birth year], 52 y.o.   MRN: [id number]   Location:  [facility] [place] [facility]  Code Status: does not yet having living will or hcpoa   Allergies  Allergen Reactions  . Ace Inhibitors Cough    Chief Complaint  Patient presents with  . Establish Care    NP- Establish Care, she is fasting this am.  . Immunizations    declines flu vaccine, never had Pneumo, and will get the Tdap today    HPI: Patient is a 52 y.o.  White female seen in the office today to establish with the practice.  Doing well.  She has no concerns herself.  Per her daughter, Cassandra Pineda (NP), pt sees Dr. Kathrene Bongo and ob/gyn, but no PCP.  After her nephrotic syndrome has had her htn, hyperlipidemia and also on celexa for mood.  Also needs colonoscopy.  She says no.  Was out of bp pills and still very good today.  Would like to be off both bp and cholesterol meds.  Has had no further nephrotic syndrome symptoms.  Off lasix for a year.    Probably over a year since cholesterol was checked.    Review of Systems:  Review of Systems  Constitutional: Negative for fever and chills.       Weight gain since quit smoking about 6 years ago and went through menopause  HENT: Negative for congestion and hearing loss.   Eyes: Negative for blurred vision.       Needs to go to eye doctor for prescription update  Respiratory: Negative for cough, sputum production and shortness of breath.        Some sob due to prior smoking  Cardiovascular: Negative for chest pain, palpitations and leg swelling.  Gastrointestinal: Negative for heartburn, constipation and blood in stool.  Genitourinary: Negative for dysuria, urgency and frequency.  Musculoskeletal: Negative for falls and myalgias.  Neurological: Positive for sensory change. Negative for dizziness, loss of consciousness and headaches.       Left carpal tunnel  Endo/Heme/Allergies: Bruises/bleeds easily.    Psychiatric/Behavioral: Positive for depression. Negative for memory loss. The patient is nervous/anxious. The patient does not have insomnia.        Moody per daughter;  On celexa for 6-7 years--did help at first;  Doesn't use xanax often--only if something terrible happens--not filled in the last year--uses half pill     Past Medical History  Diagnosis Date  . Hypertension     HTN related to a kidney disorder  . Renal disorder   . Depression   . Hyperlipidemia     Past Surgical History  Procedure Laterality Date  . Renal biopsy  2013    [name]  . Tubal ligation  1993    Social History:   reports that she quit smoking about 30 years ago. Her smoking use included Cigarettes. She has a 45 pack-year smoking history. She does not have any smokeless tobacco history on file. She reports that she does not drink alcohol or use illicit drugs.  Family History  Problem Relation Age of Onset  . Heart disease Mother 70  . Parkinson's disease Father   . Asthma Sister   . Hypertension Sister   . Lymphoma Sister 74  . Hypertension Brother   . Heart disease Brother 45    heart attack  . Hypertension Sister   . Hypertension Brother     Medications: Patient's Medications  New  Prescriptions   No medications on file  Previous Medications   ALPRAZOLAM (XANAX) 0.5 MG TABLET    Take 0.5 mg by mouth every 8 (eight) hours as needed for anxiety.   CITALOPRAM (CELEXA) 20 MG TABLET    Take 20 mg by mouth daily.     LOSARTAN (COZAAR) 100 MG TABLET    Take 100 mg by mouth daily.   MULTIPLE VITAMIN (MULTIVITAMIN) TABLET    Take 1 tablet by mouth daily.   SIMVASTATIN (ZOCOR) 20 MG TABLET    Take 20 mg by mouth at bedtime.    Modified Medications   No medications on file  Discontinued Medications   BENZONATATE (TESSALON) 100 MG CAPSULE    Take 100 mg by mouth 3 (three) times daily as needed. For cough    CHLORPHENIRAMINE-DM (CORICIDIN HBP COUGH/COLD PO)    Take 1 tablet by mouth  daily as needed.     FUROSEMIDE (LASIX) 80 MG TABLET    Take 80 mg by mouth daily.     HYDROCODONE-HOMATROPINE (HYCODAN) 5-1.5 MG/5ML SYRUP    Take 5-10 mLs by mouth every 6 (six) hours as needed. For cough    NEBIVOLOL (BYSTOLIC) 5 MG TABLET    Take 5 mg by mouth daily.       Physical Exam: Filed Vitals:   06/18/13 0850  BP: 122/70  Pulse: 65  Temp: 97.9 F (36.6 C)  TempSrc: Oral  Resp: 14  Height: 5' 4.5" (1.638 m)  Weight: 158 lb 12.8 oz (72.031 kg)  SpO2: 98%  Physical Exam  Constitutional: She is oriented to person, place, and time. She appears well-developed and well-nourished. No distress.  HENT:  Head: Normocephalic and atraumatic.  Right Ear: External ear normal.  Left Ear: External ear normal.  Nose: Nose normal.  Mouth/Throat: Oropharynx is clear and moist. No oropharyngeal exudate.  Eyes: Conjunctivae and EOM are normal. Pupils are equal, round, and reactive to light.  Wears glasses  Neck: Normal range of motion. Neck supple. No JVD present. No tracheal deviation present. No thyromegaly present.  Cardiovascular: Normal rate, regular rhythm, normal heart sounds and intact distal pulses.  Exam reveals no gallop and no friction rub.   No murmur heard. Pulmonary/Chest: Effort normal and breath sounds normal. No respiratory distress. She has no wheezes.  Abdominal: Soft. Bowel sounds are normal. She exhibits no distension. There is no tenderness.  Musculoskeletal: Normal range of motion. She exhibits no edema and no tenderness.  Lymphadenopathy:    She has no cervical adenopathy.  Neurological: She is alert and oriented to person, place, and time. She has normal reflexes. No cranial nerve deficit.  Skin: Skin is warm and dry.  Several bruises on hands, arms, thin skin  Psychiatric: She has a normal mood and affect. Her behavior is normal. Judgment and thought content normal.   Assessment/Plan 1. Dyspnea on exertion - albuterol (PROVENTIL HFA;VENTOLIN HFA) 108 (90  BASE) MCG/ACT inhaler; Inhale 2 puffs into the lungs every 6 (six) hours as needed for wheezing or shortness of breath.  Dispense: 1 Inhaler; Refill: 0   Was added to med list -rarely needs albuterol -may need PFTs in the future due to prior smoking history  2. Nephrotic syndrome -has been asymptomatic now for years and off lasix for 1 year -f/u renal function, cmp, flp, urinalysis for protein - CBC with Differential - Comprehensive metabolic panel - Urinalysis with Reflex Microscopic  3. Hyperlipidemia LDL goal < 100 - Comprehensive metabolic panel - Lipid panel--?TG  due to nephrotic syndrome, cont statin for now  4. Depression -moods fluctuate per her daughter -cont celexa at this point, but may need this changed to a newer agent b/c it is not too helpful anymore -rarely uses benzos--primarily when "awful things happen"  5. Hypertension, renal disease -bp excellent this am -cont losartan  6. Anxiety -cont rare as needed benzo and daily SSRI  7. Encounter for screening colonoscopy for non-high-risk patient - due for routine screening cscope - Ambulatory referral to Gastroenterology--requests female physician--referred to Dr. Juanda Chance  Labs/tests ordered:  Cbc, cmp, flp, UA with micro  Next appt:  6 mos unless needed sooner--will call with lab results

## 2013-06-18 NOTE — Addendum Note (Signed)
Addended by: Waymond Cera on: 06/18/2013 10:07 AM   Modules accepted: Orders

## 2013-06-19 ENCOUNTER — Encounter: Payer: Self-pay | Admitting: Internal Medicine

## 2013-06-19 LAB — COMPREHENSIVE METABOLIC PANEL
ALT: 14 IU/L (ref 0–32)
AST: 13 IU/L (ref 0–40)
Albumin/Globulin Ratio: 1.7 (ref 1.1–2.5)
Albumin: 4.3 g/dL (ref 3.5–5.5)
Alkaline Phosphatase: 91 IU/L (ref 39–117)
BUN/Creatinine Ratio: 16 (ref 9–23)
BUN: 14 mg/dL (ref 6–24)
CO2: 24 mmol/L (ref 18–29)
Calcium: 9.3 mg/dL (ref 8.7–10.2)
Chloride: 101 mmol/L (ref 97–108)
Creatinine, Ser: 0.9 mg/dL (ref 0.57–1.00)
GFR calc Af Amer: 84 mL/min/{1.73_m2} (ref >59)
GFR calc non Af Amer: 73 mL/min/{1.73_m2} (ref >59)
Globulin, Total: 2.5 g/dL (ref 1.5–4.5)
Glucose: 102 mg/dL — ABNORMAL HIGH (ref 65–99)
Potassium: 4.4 mmol/L (ref 3.5–5.2)
Sodium: 139 mmol/L (ref 134–144)
Total Bilirubin: 0.5 mg/dL (ref 0.0–1.2)
Total Protein: 6.8 g/dL (ref 6.0–8.5)

## 2013-06-19 LAB — LIPID PANEL
Chol/HDL Ratio: 3.2 ratio units (ref 0.0–4.4)
Cholesterol, Total: 159 mg/dL (ref 100–199)
HDL: 49 mg/dL (ref >39)
LDL Calculated: 82 mg/dL (ref 0–99)
Triglycerides: 142 mg/dL (ref 0–149)
VLDL Cholesterol Cal: 28 mg/dL (ref 5–40)

## 2013-06-19 LAB — URINALYSIS, ROUTINE W REFLEX MICROSCOPIC
Bilirubin, UA: NEGATIVE
Glucose, UA: NEGATIVE
Ketones, UA: NEGATIVE
Leukocytes, UA: NEGATIVE
Nitrite, UA: NEGATIVE
Protein, UA: NEGATIVE
RBC, UA: NEGATIVE
Specific Gravity, UA: 1.022 (ref 1.005–1.030)
Urobilinogen, Ur: 0.2 mg/dL (ref 0.0–1.9)
pH, UA: 5.5 (ref 5.0–7.5)

## 2013-06-19 LAB — CBC WITH DIFFERENTIAL/PLATELET
Basophils Absolute: 0 10*3/uL (ref 0.0–0.2)
Basos: 1 %
Eos: 5 %
Eosinophils Absolute: 0.4 10*3/uL (ref 0.0–0.4)
HCT: 36.3 % (ref 34.0–46.6)
Hemoglobin: 12.1 g/dL (ref 11.1–15.9)
Immature Grans (Abs): 0 10*3/uL (ref 0.0–0.1)
Immature Granulocytes: 0 %
Lymphocytes Absolute: 2.4 10*3/uL (ref 0.7–3.1)
Lymphs: 28 %
MCH: 29.8 pg (ref 26.6–33.0)
MCHC: 33.3 g/dL (ref 31.5–35.7)
MCV: 89 fL (ref 79–97)
Monocytes Absolute: 0.5 10*3/uL (ref 0.1–0.9)
Monocytes: 6 %
Neutrophils Absolute: 5.3 10*3/uL (ref 1.4–7.0)
Neutrophils Relative %: 60 %
RBC: 4.06 x10E6/uL (ref 3.77–5.28)
RDW: 13 % (ref 12.3–15.4)
WBC: 8.7 10*3/uL (ref 3.4–10.8)

## 2013-06-23 ENCOUNTER — Other Ambulatory Visit: Payer: Self-pay | Admitting: Adult Health

## 2013-08-10 ENCOUNTER — Ambulatory Visit (AMBULATORY_SURGERY_CENTER): Payer: Self-pay | Admitting: *Deleted

## 2013-08-10 VITALS — Ht 64.0 in | Wt 158.0 lb

## 2013-08-10 DIAGNOSIS — Z1211 Encounter for screening for malignant neoplasm of colon: Secondary | ICD-10-CM

## 2013-08-10 MED ORDER — MOVIPREP 100 G PO SOLR: 1.0000 | Freq: Once | ORAL | Status: DC

## 2013-08-10 NOTE — Progress Notes (Signed)
No egg or soy allergy. emw No cpap or home 02 use. ewm Pt states she has had post op nausea vomiting in the past. emw Pt declined emmi video. ewm

## 2013-08-17 ENCOUNTER — Encounter: Payer: Self-pay | Admitting: Internal Medicine

## 2013-08-22 ENCOUNTER — Other Ambulatory Visit: Payer: Self-pay | Admitting: Adult Health

## 2013-08-24 ENCOUNTER — Encounter: Payer: Self-pay | Admitting: Internal Medicine

## 2013-08-24 ENCOUNTER — Ambulatory Visit (AMBULATORY_SURGERY_CENTER): Payer: Managed Care, Other (non HMO) | Admitting: Internal Medicine

## 2013-08-24 VITALS — BP 153/73 | HR 52 | Temp 97.6°F | Resp 13 | Ht 64.0 in | Wt 158.0 lb

## 2013-08-24 DIAGNOSIS — Z1211 Encounter for screening for malignant neoplasm of colon: Secondary | ICD-10-CM

## 2013-08-24 MED ORDER — SODIUM CHLORIDE 0.9 % IV SOLN: 500.0000 mL | INTRAVENOUS | Status: DC

## 2013-08-24 NOTE — Progress Notes (Signed)
Report to pacu rn, vss, bbs=clear 

## 2013-08-24 NOTE — Op Note (Signed)
[facility] [address] [place], [id number]   COLONOSCOPY PROCEDURE REPORT  PATIENT: Cassandra Pineda, Daven B.  MR#: [id number] BIRTHDATE: [birth year] , 52  yrs. old GENDER: Female ENDOSCOPIST: Hart Carwinora M Adriene Padula, MD REFERRED OZ:[id number]:Tiffany Reed, M.D. PROCEDURE DATE:  08/24/2013 PROCEDURE:   Colonoscopy, screening First Screening Colonoscopy - Avg.  risk and is 50 yrs.  old or older Yes.  Prior Negative Screening - Now for repeat screening. N/A  History of Adenoma - Now for follow-up colonoscopy & has been > or = to 3 yrs.  N/A  Polyps Removed Today? No.  Recommend repeat exam, <10 yrs? No. ASA CLASS:   Class I INDICATIONS:Average risk patient for colon cancer. MEDICATIONS: MAC sedation, administered by CRNA and propofol (Diprivan) 250mg  IV  DESCRIPTION OF PROCEDURE:   After the risks benefits and alternatives of the procedure were thoroughly explained, informed consent was obtained.  A digital rectal exam revealed no abnormalities of the rectum.   The LB [id number]-[id number]  endoscope was introduced through the anus and advanced to the cecum, which was identified by both the appendix and ileocecal valve. No adverse events experienced.   The quality of the prep was excellent, using MoviPrep  The instrument was then slowly withdrawn as the colon was fully examined.      COLON FINDINGS: A normal appearing cecum, ileocecal valve, and appendiceal orifice were identified.  The ascending, hepatic flexure, transverse, splenic flexure, descending, sigmoid colon and rectum appeared unremarkable.  No polyps or cancers were seen. Retroflexed views revealed no abnormalities. The time to cecum=3 minutes 12 seconds.  Withdrawal time=8 minutes 37 seconds.  The scope was withdrawn and the procedure completed. COMPLICATIONS: There were no complications.  ENDOSCOPIC IMPRESSION: Normal colon  RECOMMENDATIONS: high fiber diet Recall colonoscopy in 10 years   eSigned:  Hart Carwinora M Maryjo Ragon,  MD 08/24/2013 11:38 AM   cc:

## 2013-08-24 NOTE — Patient Instructions (Signed)
Impressions/recommendations  YOU HAD AN ENDOSCOPIC PROCEDURE TODAY AT THE [facility]: Refer to the procedure report that was given to you for any specific questions about what was found during the examination.  If the procedure report does not answer your questions, please call your gastroenterologist to clarify.  If you requested that your care partner not be given the details of your procedure findings, then the procedure report has been included in a sealed envelope for you to review at your convenience later.  YOU SHOULD EXPECT: Some feelings of bloating in the abdomen. Passage of more gas than usual.  Walking can help get rid of the air that was put into your GI tract during the procedure and reduce the bloating. If you had a lower endoscopy (such as a colonoscopy or flexible sigmoidoscopy) you may notice spotting of blood in your stool or on the toilet paper. If you underwent a bowel prep for your procedure, then you may not have a normal bowel movement for a few days.  DIET: Your first meal following the procedure should be a light meal and then it is ok to progress to your normal diet.  A half-sandwich or bowl of soup is an example of a good first meal.  Heavy or fried foods are harder to digest and may make you feel nauseous or bloated.  Likewise meals heavy in dairy and vegetables can cause extra gas to form and this can also increase the bloating.  Drink plenty of fluids but you should avoid alcoholic beverages for 24 hours.  ACTIVITY: Your care partner should take you home directly after the procedure.  You should plan to take it easy, moving slowly for the rest of the day.  You can resume normal activity the day after the procedure however you should NOT DRIVE or use heavy machinery for 24 hours (because of the sedation medicines used during the test).    SYMPTOMS TO REPORT IMMEDIATELY: A gastroenterologist can be reached at any hour.  During normal business hours, 8:30 AM to  5:00 PM Monday through Friday, call [phone].  After hours and on weekends, please call the GI answering service at [phone] who will take a message and have the physician on call contact you.   Following lower endoscopy (colonoscopy or flexible sigmoidoscopy):  Excessive amounts of blood in the stool  Significant tenderness or worsening of abdominal pains  Swelling of the abdomen that is new, acute  Fever of 100F or higher  FOLLOW UP: If any biopsies were taken you will be contacted by phone or by letter within the next 1-3 weeks.  Call your gastroenterologist if you have not heard about the biopsies in 3 weeks.  Our staff will call the home number listed on your records the next business day following your procedure to check on you and address any questions or concerns that you may have at that time regarding the information given to you following your procedure. This is a courtesy call and so if there is no answer at the home number and we have not heard from you through the emergency physician on call, we will assume that you have returned to your regular daily activities without incident.  SIGNATURES/CONFIDENTIALITY: You and/or your care partner have signed paperwork which will be entered into your electronic medical record.  These signatures attest to the fact that that the information above on your After Visit Summary has been reviewed and is understood.  Full responsibility of the confidentiality  of this discharge information lies with you and/or your care-partner.

## 2013-08-27 ENCOUNTER — Telehealth: Payer: Self-pay | Admitting: *Deleted

## 2013-08-27 NOTE — Telephone Encounter (Signed)
  Follow up Call-  Call back number 08/24/2013  Post procedure Call Back phone  # [phone]  Permission to leave phone message Yes    [facility]

## 2013-10-09 ENCOUNTER — Other Ambulatory Visit: Payer: Self-pay | Admitting: Adult Health

## 2013-10-09 DIAGNOSIS — Z1231 Encounter for screening mammogram for malignant neoplasm of breast: Secondary | ICD-10-CM

## 2013-10-15 ENCOUNTER — Ambulatory Visit (HOSPITAL_COMMUNITY)
Admission: RE | Admit: 2013-10-15 | Discharge: 2013-10-15 | Disposition: A | Discharge status: Home / Self Care | Payer: Managed Care, Other (non HMO) | Source: Ambulatory Visit | Attending: Adult Health | Admitting: Adult Health

## 2013-10-15 DIAGNOSIS — Z1231 Encounter for screening mammogram for malignant neoplasm of breast: Secondary | ICD-10-CM | POA: Insufficient documentation

## 2013-10-23 ENCOUNTER — Ambulatory Visit (INDEPENDENT_AMBULATORY_CARE_PROVIDER_SITE_OTHER): Payer: Managed Care, Other (non HMO) | Admitting: Adult Health

## 2013-10-23 ENCOUNTER — Encounter: Payer: Self-pay | Admitting: Adult Health

## 2013-10-23 VITALS — BP 120/76 | HR 74 | Ht 64.0 in | Wt 155.0 lb

## 2013-10-23 DIAGNOSIS — Z1212 Encounter for screening for malignant neoplasm of rectum: Secondary | ICD-10-CM

## 2013-10-23 DIAGNOSIS — E785 Hyperlipidemia, unspecified: Secondary | ICD-10-CM

## 2013-10-23 DIAGNOSIS — I129 Hypertensive chronic kidney disease with stage 1 through stage 4 chronic kidney disease, or unspecified chronic kidney disease: Secondary | ICD-10-CM

## 2013-10-23 DIAGNOSIS — F419 Anxiety disorder, unspecified: Secondary | ICD-10-CM

## 2013-10-23 DIAGNOSIS — Z01419 Encounter for gynecological examination (general) (routine) without abnormal findings: Secondary | ICD-10-CM

## 2013-10-23 LAB — HEMOCCULT GUIAC POC 1CARD (OFFICE): FECAL OCCULT BLD: NEGATIVE

## 2013-10-23 MED ORDER — ALPRAZOLAM 0.5 MG PO TABS: 0.5000 mg | ORAL_TABLET | Freq: Three times a day (TID) | ORAL | Status: DC | PRN

## 2013-10-23 NOTE — Patient Instructions (Signed)
Physical in 1 year Mammogram yearly Colonoscopy in 2025

## 2013-10-23 NOTE — Progress Notes (Signed)
Patient ID: Cassandra Pineda, female   DOB: 11/29/1960, 53 y.o.   MRN: [id number] History of Present Illness:  Cassandra Pineda is a 53 year old white female, married, in for a physical.She had a normal pap with negative HPV 05/03/12.Had colonoscopy this year.  Current Medications, Allergies, Past Medical History, Past Surgical History, Family History and Social History were reviewed in [facility] Link electronic medical record.     Review of Systems: Patient denies any headaches, blurred vision, shortness of breath, chest pain, abdominal pain, problems with bowel movements, urination, or intercourse. No joint pain or mood swings.Says she has had a better year.    Physical Exam:BP 120/76  Pulse 74  Ht 5\' 4"  (1.626 m)  Wt 155 lb (70.308 kg)  BMI 26.59 kg/m2 General:  Well developed, well nourished, no acute distress Skin:  Warm and dry,tan Neck:  Midline trachea, normal thyroid Lungs; Clear to auscultation bilaterally Breast:  No dominant palpable mass, retraction, or nipple discharge Cardiovascular: Regular rate and rhythm Abdomen:  Soft, non tender, no hepatosplenomegaly Pelvic:  External genitalia is normal in appearance.  The vagina is normal in appearance.  The cervix is atrophic.  Uterus is felt to be normal size, shape, and contour.  No  adnexal masses or tenderness noted. Rectal: Good sphincter tone, no polyps,small internal hemorrhoids felt.  Hemoccult negative. Extremities:  No swelling or varicosities noted Psych:  No mood changes, alert and cooperative, seems happy Reviewed labs from Dr Renato Gailseed,  Impression: Yearly gyn exam no pap Anxiety Elevated cholesterol,normal with meds Hypertension,renal disease     Plan: Physical in 1 year Mammogram yearly Colonoscopy 2025 Labs at PCP Refilled xanax 0.5 mg #60 1 every 8 hours prn, no refills

## 2013-12-27 ENCOUNTER — Ambulatory Visit: Payer: Managed Care, Other (non HMO) | Admitting: Internal Medicine

## 2014-02-14 ENCOUNTER — Ambulatory Visit (INDEPENDENT_AMBULATORY_CARE_PROVIDER_SITE_OTHER): Payer: Managed Care, Other (non HMO) | Admitting: Internal Medicine

## 2014-02-14 ENCOUNTER — Encounter: Payer: Self-pay | Admitting: Internal Medicine

## 2014-02-14 VITALS — BP 122/78 | HR 60 | Temp 98.1°F | Resp 10 | Ht 65.0 in | Wt 157.0 lb

## 2014-02-14 DIAGNOSIS — R739 Hyperglycemia, unspecified: Secondary | ICD-10-CM

## 2014-02-14 DIAGNOSIS — F32A Depression, unspecified: Secondary | ICD-10-CM

## 2014-02-14 DIAGNOSIS — F411 Generalized anxiety disorder: Secondary | ICD-10-CM

## 2014-02-14 DIAGNOSIS — R7309 Other abnormal glucose: Secondary | ICD-10-CM

## 2014-02-14 DIAGNOSIS — R0989 Other specified symptoms and signs involving the circulatory and respiratory systems: Secondary | ICD-10-CM

## 2014-02-14 DIAGNOSIS — F419 Anxiety disorder, unspecified: Secondary | ICD-10-CM

## 2014-02-14 DIAGNOSIS — F3289 Other specified depressive episodes: Secondary | ICD-10-CM

## 2014-02-14 DIAGNOSIS — Z0000 Encounter for general adult medical examination without abnormal findings: Secondary | ICD-10-CM

## 2014-02-14 DIAGNOSIS — F329 Major depressive disorder, single episode, unspecified: Secondary | ICD-10-CM

## 2014-02-14 DIAGNOSIS — R0609 Other forms of dyspnea: Secondary | ICD-10-CM

## 2014-02-14 DIAGNOSIS — N049 Nephrotic syndrome with unspecified morphologic changes: Secondary | ICD-10-CM

## 2014-02-14 MED ORDER — ALBUTEROL SULFATE HFA 108 (90 BASE) MCG/ACT IN AERS: 2.0000 | INHALATION_SPRAY | Freq: Four times a day (QID) | RESPIRATORY_TRACT | Status: DC | PRN

## 2014-02-14 NOTE — Progress Notes (Signed)
Patient ID: Cassandra Pineda, female   DOB: 11/05/1960, 53 y.o.   MRN: [id number]   Location:  [facility] [place] [facility]  Code Status: does not yet have living will or hcpoa  Allergies  Allergen Reactions  . Ace Inhibitors Cough    Chief Complaint  Patient presents with  . Medical Management of Chronic Issues    6 month follow-up, no recent labs.     HPI: Patient is a 53 y.o. white female seen in the office today for medical mgt of chronic diseases.  Labs in 12/14 were excellent.  Nothing is new.  She is doing great.  Had normal mammogram in April.  Saw her gyn for her annual exam 4/21.  Had her colonoscopy with Dr. Juanda Chance 2/20.  Mood is good.  Says she has bad days here and there.  Says her daughter would like her on something to make her float.   Walks every day, but does not have much time.  Tries to do planks 45 seconds.    Review of Systems:  Review of Systems  Constitutional: Negative for fever, chills and malaise/fatigue.  HENT: Negative for congestion.   Eyes: Negative for blurred vision.  Respiratory: Negative for cough and wheezing.   Cardiovascular: Negative for chest pain.  Gastrointestinal: Negative for abdominal pain, constipation, blood in stool and melena.  Genitourinary: Negative for dysuria, urgency and frequency.  Musculoskeletal: Negative for falls, joint pain and myalgias.  Skin: Negative for itching and rash.  Neurological: Negative for dizziness, loss of consciousness, weakness and headaches.  Endo/Heme/Allergies: Does not bruise/bleed easily.  Psychiatric/Behavioral: Negative for depression and memory loss. The patient is not nervous/anxious.     Past Medical History  Diagnosis Date  . Hypertension, renal disease     HTN related to a kidney disorder  . Renal disorder   . Depression   . Hyperlipidemia LDL goal < 100   . Nephrotic syndrome   . Anxiety     Past Surgical History  Procedure Laterality Date  . Renal biopsy   2013    [name]  . Tubal ligation  1993    Social History:   reports that she quit smoking about 31 years ago. Her smoking use included Cigarettes. She has a 45 pack-year smoking history. She has never used smokeless tobacco. She reports that she does not drink alcohol or use illicit drugs.  Family History  Problem Relation Age of Onset  . Heart disease Mother 86  . Parkinson's disease Father   . Asthma Sister   . Hypertension Sister   . Lymphoma Sister 44  . Hypertension Brother   . Heart disease Brother 45    heart attack  . Hypertension Sister   . Hypertension Brother   . Colon cancer Neg Hx     Medications: Patient's Medications  New Prescriptions   No medications on file  Previous Medications   ACETAMINOPHEN (TYLENOL) 325 MG TABLET    Take 650 mg by mouth every 6 (six) hours as needed.   ALPRAZOLAM (XANAX) 0.5 MG TABLET    Take 1 tablet (0.5 mg total) by mouth every 8 (eight) hours as needed for anxiety.   CITALOPRAM (CELEXA) 20 MG TABLET    TAKE ONE TABLET BY MOUTH DAILY   LOSARTAN (COZAAR) 100 MG TABLET    Take 100 mg by mouth daily.   MULTIPLE VITAMIN (MULTIVITAMIN) TABLET    Take 1 tablet by mouth daily.   NYSTATIN (MYCOSTATIN) 100000  UNIT/ML SUSPENSION    As needed for mouth uncles   SIMVASTATIN (ZOCOR) 20 MG TABLET    TAKE 1 TABLET BY MOUTH EVERY NIGHT AT BEDTIME  Modified Medications   Modified Medication Previous Medication   ALBUTEROL (PROVENTIL HFA;VENTOLIN HFA) 108 (90 BASE) MCG/ACT INHALER albuterol (PROVENTIL HFA;VENTOLIN HFA) 108 (90 BASE) MCG/ACT inhaler      Inhale 2 puffs into the lungs every 6 (six) hours as needed for wheezing or shortness of breath.    Inhale 2 puffs into the lungs every 6 (six) hours as needed for wheezing or shortness of breath.  Discontinued Medications   No medications on file     Physical Exam: Filed Vitals:   02/14/14 1541  BP: 122/78  Pulse: 60  Temp: 98.1 F (36.7 C)  TempSrc: Oral  Resp: 10  Height:  5\' 5"  (1.651 m)  Weight: 157 lb (71.215 kg)  SpO2: 96%  Physical Exam  Constitutional: She is oriented to person, place, and time. She appears well-developed and well-nourished.  Cardiovascular: Normal rate, regular rhythm, normal heart sounds and intact distal pulses.   Pulmonary/Chest: Effort normal and breath sounds normal. No respiratory distress.  Abdominal: Soft. Bowel sounds are normal. She exhibits no distension and no mass. There is no tenderness.  Musculoskeletal: Normal range of motion. She exhibits no edema and no tenderness.  Neurological: She is alert and oriented to person, place, and time.  Skin: Skin is warm and dry.  Psychiatric: She has a normal mood and affect.     Labs reviewed: Basic Metabolic Panel:  Recent Labs  16/04/9611/15/14 0956  NA 139  K 4.4  CL 101  CO2 24  GLUCOSE 102*  BUN 14  CREATININE 0.90  CALCIUM 9.3   Liver Function Tests:  Recent Labs  06/18/13 0956  AST 13  ALT 14  ALKPHOS 91  BILITOT 0.5  PROT 6.8   No results found for this basename: LIPASE, AMYLASE,  in the last 8760 hours No results found for this basename: AMMONIA,  in the last 8760 hours CBC:  Recent Labs  06/18/13 0956  WBC 8.7  NEUTROABS 5.3  HGB 12.1  HCT 36.3  MCV 89   Lipid Panel:  Recent Labs  06/18/13 0956  HDL 49  LDLCALC 82  TRIG 142  CHOLHDL 3.2   Assessment/Plan 1. Dyspnea on exertion - doing well w/ inhaler - albuterol (PROVENTIL HFA;VENTOLIN HFA) 108 (90 BASE) MCG/ACT inhaler; Inhale 2 puffs into the lungs every 6 (six) hours as needed for wheezing or shortness of breath.  Dispense: 1 Inhaler; Refill: 5  2. Nephrotic syndrome -resolved, off diuretics, is on losartan - CBC With differential/Platelet; Future - Comprehensive metabolic panel; Future - Lipid panel; Future  3. Depression -cont celexa at current dose, is stable  4. Anxiety -stable, xanax was refilled by her gyn  5. General medical exam - Mammogram yearly  Colonoscopy 2025     6. Hyperglycemia -fasting glucose has been elevated a couple of times in the past, so check hba1c  - Hemoglobin A1c; Future  Labs/tests ordered:   Orders Placed This Encounter  Procedures  . CBC With differential/Platelet    Standing Status: Future     Number of Occurrences:      Standing Expiration Date: 02/15/2015  . Comprehensive metabolic panel    Standing Status: Future     Number of Occurrences:      Standing Expiration Date: 02/15/2015  . Lipid panel    Standing Status: Future  Number of Occurrences:      Standing Expiration Date: 02/15/2015  . Hemoglobin A1c    Standing Status: Future     Number of Occurrences:      Standing Expiration Date: 02/15/2015    Next appt:  6 mos for annual visit

## 2014-03-13 ENCOUNTER — Other Ambulatory Visit: Payer: Self-pay | Admitting: Dermatology

## 2014-07-22 ENCOUNTER — Other Ambulatory Visit: Payer: Self-pay | Admitting: *Deleted

## 2014-07-22 MED ORDER — SIMVASTATIN 20 MG PO TABS: 20.0000 mg | ORAL_TABLET | Freq: Every day | ORAL | Status: DC

## 2014-09-16 ENCOUNTER — Other Ambulatory Visit: Payer: Self-pay | Admitting: *Deleted

## 2014-09-16 MED ORDER — CITALOPRAM HYDROBROMIDE 20 MG PO TABS: 20.0000 mg | ORAL_TABLET | Freq: Every day | ORAL | Status: DC

## 2014-10-04 ENCOUNTER — Other Ambulatory Visit: Payer: Self-pay | Admitting: Adult Health

## 2014-10-04 DIAGNOSIS — Z1231 Encounter for screening mammogram for malignant neoplasm of breast: Secondary | ICD-10-CM

## 2014-11-04 ENCOUNTER — Encounter: Payer: Self-pay | Admitting: Adult Health

## 2014-11-04 ENCOUNTER — Ambulatory Visit (INDEPENDENT_AMBULATORY_CARE_PROVIDER_SITE_OTHER): Payer: Managed Care, Other (non HMO) | Admitting: Adult Health

## 2014-11-04 ENCOUNTER — Other Ambulatory Visit (HOSPITAL_COMMUNITY)
Admission: RE | Admit: 2014-11-04 | Discharge: 2014-11-04 | Disposition: A | Discharge status: Home / Self Care | Payer: Managed Care, Other (non HMO) | Source: Ambulatory Visit | Attending: Adult Health | Admitting: Adult Health

## 2014-11-04 VITALS — BP 130/62 | HR 60 | Ht 63.25 in | Wt 159.0 lb

## 2014-11-04 DIAGNOSIS — Z1151 Encounter for screening for human papillomavirus (HPV): Secondary | ICD-10-CM | POA: Diagnosis present

## 2014-11-04 DIAGNOSIS — Z01419 Encounter for gynecological examination (general) (routine) without abnormal findings: Secondary | ICD-10-CM | POA: Insufficient documentation

## 2014-11-04 DIAGNOSIS — Z1212 Encounter for screening for malignant neoplasm of rectum: Secondary | ICD-10-CM | POA: Diagnosis not present

## 2014-11-04 DIAGNOSIS — F419 Anxiety disorder, unspecified: Secondary | ICD-10-CM

## 2014-11-04 LAB — HEMOCCULT GUIAC POC 1CARD (OFFICE): FECAL OCCULT BLD: NEGATIVE

## 2014-11-04 NOTE — Progress Notes (Signed)
Patient ID: Cassandra Pineda, female   DOB: 1960-11-24, 54 y.o.   MRN: [id number] History of Present Illness: Cassandra Pineda is a 54 year old white female,married in for well woman gyn exam and pap.She is new grandma. PCP is Dr Bufford Spikesiffany Reed. Kidney doctor is Claudina LickKelly Goldberg.  Current Medications, Allergies, Past Medical History, Past Surgical History, Family History and Social History were reviewed in [facility] Link electronic medical record.     Review of Systems:  Patient denies any headaches, hearing loss, fatigue, blurred vision, shortness of breath, chest pain, abdominal pain, problems with bowel movements, urination, or intercourse. No joint pain or mood swings.Her kidney doctor has halved her BP meds.She is good with celexa,she wants to stop zocor and and then check labs to see what her numbers are and I think that would be OK,she hopes to get off some meds now that kidneys are better.   Physical Exam:BP 130/62 mmHg  Pulse 60  Ht 5' 3.25" (1.607 m)  Wt 159 lb (72.122 kg)  BMI 27.93 kg/m2 General:  Well developed, well nourished, no acute distress Skin:  Warm and dry Neck:  Midline trachea, normal thyroid, good ROM, no lymphadenopathy Lungs; Clear to auscultation bilaterally Breast:  No dominant palpable mass, retraction, or nipple discharge Cardiovascular: Regular rate and rhythm Abdomen:  Soft, non tender, no hepatosplenomegaly Pelvic:  External genitalia is normal in appearance, no lesions.  The vagina has decreased color, moisture and rugae. Urethra has no lesions or masses. The cervix is smooth, pap with HPV performed.  Uterus is felt to be normal size, shape, and contour.  No adnexal masses or tenderness noted.Bladder is non tender, no masses felt. Rectal: Good sphincter tone, no polyps, or hemorrhoids felt.  Hemoccult negative. Extremities/musculoskeletal:  No swelling or varicosities noted, has some spider veins, no clubbing or cyanosis Psych:  No mood changes, alert and  cooperative,seems happy   Impression: Well woman gyn exam with pap Anxiety     Plan: Physical in 1 year,pap in 3 if normal with negative HPV Mammogram yearly  Labs with Dr Bufford Spikesiffany Reed Colonoscopy per GI Has refills on celexa

## 2014-11-04 NOTE — Patient Instructions (Signed)
Physical in 1 year Mammogram yearly Labs with PCP Colonoscopy per GI 

## 2014-11-06 LAB — CYTOLOGY - PAP

## 2014-11-11 ENCOUNTER — Ambulatory Visit (HOSPITAL_COMMUNITY)
Admission: RE | Admit: 2014-11-11 | Discharge: 2014-11-11 | Disposition: A | Discharge status: Home / Self Care | Payer: Managed Care, Other (non HMO) | Source: Ambulatory Visit | Attending: Adult Health | Admitting: Adult Health

## 2014-11-11 DIAGNOSIS — Z1231 Encounter for screening mammogram for malignant neoplasm of breast: Secondary | ICD-10-CM | POA: Diagnosis present

## 2014-12-03 ENCOUNTER — Other Ambulatory Visit: Payer: Self-pay | Admitting: Adult Health

## 2014-12-03 MED ORDER — ALPRAZOLAM 0.5 MG PO TABS: 0.5000 mg | ORAL_TABLET | Freq: Three times a day (TID) | ORAL | Status: DC | PRN

## 2015-02-20 ENCOUNTER — Telehealth: Payer: Self-pay

## 2015-02-20 ENCOUNTER — Other Ambulatory Visit: Payer: Self-pay | Admitting: *Deleted

## 2015-02-20 DIAGNOSIS — I10 Essential (primary) hypertension: Secondary | ICD-10-CM

## 2015-02-20 DIAGNOSIS — E785 Hyperlipidemia, unspecified: Secondary | ICD-10-CM

## 2015-02-20 NOTE — Telephone Encounter (Signed)
Left message on voicemail for patient to return call when available   

## 2015-02-20 NOTE — Telephone Encounter (Signed)
-----   Message from Kermit Balo, DO sent at 02/20/2015 12:20 PM EDT ----- Cbc, cmp, flp, hba1c were meant to be done before her annual exam which is due this month.  Is she planning on returning for that visit?   ----- Message -----    From: SYSTEM    Sent: 02/20/2015  12:04 AM      To: Kermit Balo, DO

## 2015-02-24 NOTE — Telephone Encounter (Signed)
Patient called to schedule appointment for this Thursday for fasting labs. Pending appointment with Dr.Reed in September 2016

## 2015-02-27 ENCOUNTER — Other Ambulatory Visit: Payer: Managed Care, Other (non HMO)

## 2015-02-27 DIAGNOSIS — E785 Hyperlipidemia, unspecified: Secondary | ICD-10-CM

## 2015-02-27 DIAGNOSIS — I10 Essential (primary) hypertension: Secondary | ICD-10-CM

## 2015-02-28 LAB — LIPID PANEL
Chol/HDL Ratio: 3.2 ratio units (ref 0.0–4.4)
Cholesterol, Total: 161 mg/dL (ref 100–199)
HDL: 50 mg/dL (ref >39)
LDL Calculated: 85 mg/dL (ref 0–99)
Triglycerides: 129 mg/dL (ref 0–149)
VLDL Cholesterol Cal: 26 mg/dL (ref 5–40)

## 2015-02-28 LAB — COMPREHENSIVE METABOLIC PANEL
ALT: 11 IU/L (ref 0–32)
AST: 13 IU/L (ref 0–40)
Albumin/Globulin Ratio: 1.8 (ref 1.1–2.5)
Albumin: 4.2 g/dL (ref 3.5–5.5)
Alkaline Phosphatase: 93 IU/L (ref 39–117)
BUN/Creatinine Ratio: 18 (ref 9–23)
BUN: 18 mg/dL (ref 6–24)
Bilirubin Total: 0.5 mg/dL (ref 0.0–1.2)
CO2: 23 mmol/L (ref 18–29)
Calcium: 9.1 mg/dL (ref 8.7–10.2)
Chloride: 102 mmol/L (ref 97–108)
Creatinine, Ser: 1 mg/dL (ref 0.57–1.00)
GFR calc Af Amer: 73 mL/min/{1.73_m2} (ref >59)
GFR calc non Af Amer: 64 mL/min/{1.73_m2} (ref >59)
Globulin, Total: 2.4 g/dL (ref 1.5–4.5)
Glucose: 101 mg/dL — ABNORMAL HIGH (ref 65–99)
Potassium: 4.5 mmol/L (ref 3.5–5.2)
Sodium: 142 mmol/L (ref 134–144)
Total Protein: 6.6 g/dL (ref 6.0–8.5)

## 2015-02-28 LAB — CBC WITH DIFFERENTIAL/PLATELET
Basophils Absolute: 0.1 10*3/uL (ref 0.0–0.2)
Basos: 1 %
EOS (ABSOLUTE): 0.5 10*3/uL — ABNORMAL HIGH (ref 0.0–0.4)
Eos: 6 %
Hematocrit: 35.6 % (ref 34.0–46.6)
Hemoglobin: 12.2 g/dL (ref 11.1–15.9)
Immature Grans (Abs): 0 10*3/uL (ref 0.0–0.1)
Immature Granulocytes: 0 %
Lymphocytes Absolute: 2.3 10*3/uL (ref 0.7–3.1)
Lymphs: 30 %
MCH: 30.7 pg (ref 26.6–33.0)
MCHC: 34.3 g/dL (ref 31.5–35.7)
MCV: 90 fL (ref 79–97)
Monocytes Absolute: 0.6 10*3/uL (ref 0.1–0.9)
Monocytes: 8 %
Neutrophils Absolute: 4.3 10*3/uL (ref 1.4–7.0)
Neutrophils: 55 %
Platelets: 286 10*3/uL (ref 150–379)
RBC: 3.97 x10E6/uL (ref 3.77–5.28)
RDW: 13.3 % (ref 12.3–15.4)
WBC: 7.7 10*3/uL (ref 3.4–10.8)

## 2015-02-28 LAB — HEMOGLOBIN A1C
Est. average glucose Bld gHb Est-mCnc: 128 mg/dL
Hgb A1c MFr Bld: 6.1 % — ABNORMAL HIGH (ref 4.8–5.6)

## 2015-04-03 ENCOUNTER — Ambulatory Visit (INDEPENDENT_AMBULATORY_CARE_PROVIDER_SITE_OTHER): Payer: Managed Care, Other (non HMO) | Admitting: Internal Medicine

## 2015-04-03 ENCOUNTER — Encounter: Payer: Self-pay | Admitting: Internal Medicine

## 2015-04-03 VITALS — BP 124/70 | HR 60 | Temp 97.7°F | Resp 14 | Ht 63.0 in | Wt 156.0 lb

## 2015-04-03 DIAGNOSIS — N185 Chronic kidney disease, stage 5: Secondary | ICD-10-CM | POA: Diagnosis not present

## 2015-04-03 DIAGNOSIS — N181 Chronic kidney disease, stage 1: Secondary | ICD-10-CM

## 2015-04-03 DIAGNOSIS — I129 Hypertensive chronic kidney disease with stage 1 through stage 4 chronic kidney disease, or unspecified chronic kidney disease: Secondary | ICD-10-CM | POA: Diagnosis not present

## 2015-04-03 DIAGNOSIS — B002 Herpesviral gingivostomatitis and pharyngotonsillitis: Secondary | ICD-10-CM

## 2015-04-03 DIAGNOSIS — N183 Chronic kidney disease, stage 3 (moderate): Secondary | ICD-10-CM | POA: Diagnosis not present

## 2015-04-03 DIAGNOSIS — F329 Major depressive disorder, single episode, unspecified: Secondary | ICD-10-CM

## 2015-04-03 DIAGNOSIS — F32A Depression, unspecified: Secondary | ICD-10-CM

## 2015-04-03 DIAGNOSIS — N189 Chronic kidney disease, unspecified: Secondary | ICD-10-CM

## 2015-04-03 DIAGNOSIS — Z0000 Encounter for general adult medical examination without abnormal findings: Secondary | ICD-10-CM | POA: Diagnosis not present

## 2015-04-03 DIAGNOSIS — N184 Chronic kidney disease, stage 4 (severe): Secondary | ICD-10-CM | POA: Diagnosis not present

## 2015-04-03 DIAGNOSIS — N182 Chronic kidney disease, stage 2 (mild): Secondary | ICD-10-CM | POA: Diagnosis not present

## 2015-04-03 DIAGNOSIS — N049 Nephrotic syndrome with unspecified morphologic changes: Secondary | ICD-10-CM | POA: Diagnosis not present

## 2015-04-03 DIAGNOSIS — R739 Hyperglycemia, unspecified: Secondary | ICD-10-CM

## 2015-04-03 MED ORDER — ACYCLOVIR 200 MG PO CAPS: 200.0000 mg | ORAL_CAPSULE | Freq: Three times a day (TID) | ORAL | Status: DC

## 2015-04-03 NOTE — Progress Notes (Signed)
Location:  [facility]  Code Status: Full Code Goals of Care: Advanced Directive information Does patient have an advance directive?: Yes, Type of Advance Directive: Healthcare Power of [place];Living will, Does patient want to make changes to advanced directive?: No - Patient declined   Chief Complaint  Patient presents with  . Annual Exam    Yearly check-up, pap completed 11/2014, last EKG 2013 , and discuss labs (completed 1 month ago)   . Medical Management of Chronic Issues    Discuss new medication for mouth ulcers, current medication was ineffective  . Immunizations    Refused    HPI: Patient is a 54 y.o. female seen in the office today for annual exam and preventive care.  She is doing well. Her daughter has her counting carbs and restricting sugar intake due to her elevated HgbA1c. She is being very compliant and looking forward to see how this affects her next blood work.   She has some recurrent mouth ulcers for which nothing is providing her relief. Her dentist gave her some sticks that you break and apply to ulcer and it burns, but doesn't really offer any relief. Has also tried magic mouthwash and mycostatin suspension.  Otherwise doing well with no complaints. She is up to date with screenings.   Declines influenza vaccination due to her daughter gets sick when she receives it and she fears that she will too.    Review of Systems:  Review of Systems  Constitutional: Negative.   HENT:       Recurrent mouth ulcers  Eyes: Negative.   Respiratory: Negative.   Cardiovascular: Negative.  Negative for chest pain.  Gastrointestinal: Negative.   Genitourinary: Negative.   Musculoskeletal: Negative.   Skin: Negative.   Neurological: Negative.   Endo/Heme/Allergies: Negative for polydipsia.  Psychiatric/Behavioral: Negative.     Past Medical History  Diagnosis Date  . Hypertension, renal disease     HTN related to a  kidney disorder  . Renal disorder   . Depression   . Hyperlipidemia LDL goal < 100   . Nephrotic syndrome   . Anxiety   . Hypertension     Past Surgical History  Procedure Laterality Date  . Renal biopsy  2013    [name]  . Tubal ligation  1993    Allergies  Allergen Reactions  . Ace Inhibitors Cough   Medications: Patient's Medications  New Prescriptions   No medications on file  Previous Medications   ACETAMINOPHEN (TYLENOL) 325 MG TABLET    Take 650 mg by mouth every 6 (six) hours as needed.   ALBUTEROL (PROVENTIL HFA;VENTOLIN HFA) 108 (90 BASE) MCG/ACT INHALER    Inhale 2 puffs into the lungs every 6 (six) hours as needed for wheezing or shortness of breath.   ALPRAZOLAM (XANAX) 0.5 MG TABLET    Take 1 tablet (0.5 mg total) by mouth every 8 (eight) hours as needed for anxiety.   CITALOPRAM (CELEXA) 20 MG TABLET    Take 1 tablet (20 mg total) by mouth daily.   LOSARTAN (COZAAR) 100 MG TABLET    Take 100 mg by mouth daily.   MULTIPLE VITAMIN (MULTIVITAMIN) TABLET    Take 1 tablet by mouth daily.   SIMVASTATIN (ZOCOR) 20 MG TABLET    Take 1 tablet (20 mg total) by mouth at bedtime.  Modified Medications   No medications on file  Discontinued Medications   NYSTATIN (MYCOSTATIN) 100000 UNIT/ML SUSPENSION  As needed for mouth uncles    Physical Exam: Filed Vitals:   04/03/15 1339  BP: 124/70  Pulse: 60  Temp: 97.7 F (36.5 C)  TempSrc: Oral  Resp: 14  Height:  (1.6 m)  Weight: 156 lb (70.761 kg)  SpO2: 96%   Physical Exam  Constitutional: She is oriented to person, place, and time. She appears well-developed and well-nourished. No distress.  HENT:  Head: Normocephalic and atraumatic.  Right Ear: External ear normal.  Left Ear: External ear normal.  Mouth/Throat: No oropharyngeal exudate.  White ulceration on right inner cheek  Eyes: Conjunctivae and EOM are normal. Pupils are equal, round, and reactive to light.  Neck: Normal range of  motion. Neck supple. No thyromegaly present.  Cardiovascular: Normal rate, regular rhythm, normal heart sounds and intact distal pulses.  Exam reveals no friction rub.   No murmur heard. Pulmonary/Chest: Effort normal and breath sounds normal. She has no wheezes. She has no rales.  Abdominal: Soft. Bowel sounds are normal. She exhibits no distension and no mass. There is no tenderness.  Genitourinary:  No CVA tenderness Deferred to ob/gyn  Musculoskeletal: Normal range of motion. She exhibits no edema or tenderness.  Lymphadenopathy:    She has no cervical adenopathy.  Neurological: She is alert and oriented to person, place, and time. She has normal reflexes. She displays normal reflexes. No cranial nerve deficit. She exhibits normal muscle tone. Coordination normal.  Skin: Skin is warm and dry. No rash noted. She is not diaphoretic. No erythema.  Psychiatric: She has a normal mood and affect. Her behavior is normal.    Labs reviewed: Basic Metabolic Panel:  Recent Labs  16/10/96 0805  NA 142  K 4.5  CL 102  CO2 23  GLUCOSE 101*  BUN 18  CREATININE 1.00  CALCIUM 9.1   Liver Function Tests:  Recent Labs  02/27/15 0805  AST 13  ALT 11  ALKPHOS 93  BILITOT 0.5  PROT 6.6   No results for input(s): LIPASE, AMYLASE in the last 8760 hours. No results for input(s): AMMONIA in the last 8760 hours. CBC:  Recent Labs  02/27/15 0805  WBC 7.7  NEUTROABS 4.3  HCT 35.6   Lipid Panel:  Recent Labs  02/27/15 0805  CHOL 161  HDL 50  LDLCALC 85  TRIG 129  CHOLHDL 3.2   Lab Results  Component Value Date   HGBA1C 6.1* 02/27/2015    Procedures since last visit:  Assessment/Plan 1. 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 Nephrotic syndrome -Resolved -Continue blood pressure control with losartan  daily -Will continue to monitor for signs and symptoms of complications - Comprehensive metabolic panel; Future - Lipid panel; Future  4. Hyperglycemia -Her daughter who is an NP is helping her manage -She has done well with carb counting and diet restrictions -Will recheck Hgba1c prior to next visit to evaluate changes she has made.  - Comprehensive metabolic panel; Future - Hemoglobin A1c; Future  5. Hypertension, renal disease, stage 1-4 or unspecified chronic kidney disease -Controlled with current medication -At goal <120/80 -Continue medication Cozaar  po daily  6. Depression -Managed well with current medications -Continue Celexa  daily and Xanax .  every 8 hours as needed  Next appt: Follow up in 6 months with Dr. Renato Gails with labs prior for medical management  Ocie Bob, RN, BSN Student- Nurse Practitioner- [facility] [address] [place], [place] [address] Office Phone:  [phone] Office Fax:  [phone]

## 2015-04-23 ENCOUNTER — Other Ambulatory Visit: Payer: Self-pay | Admitting: Internal Medicine

## 2015-07-05 ENCOUNTER — Other Ambulatory Visit: Payer: Self-pay | Admitting: Adult Health

## 2015-08-11 ENCOUNTER — Other Ambulatory Visit: Payer: Self-pay | Admitting: Adult Health

## 2015-09-29 ENCOUNTER — Other Ambulatory Visit: Payer: Managed Care, Other (non HMO)

## 2015-09-29 DIAGNOSIS — R739 Hyperglycemia, unspecified: Secondary | ICD-10-CM

## 2015-09-29 DIAGNOSIS — N049 Nephrotic syndrome with unspecified morphologic changes: Secondary | ICD-10-CM

## 2015-09-29 DIAGNOSIS — B002 Herpesviral gingivostomatitis and pharyngotonsillitis: Secondary | ICD-10-CM

## 2015-09-30 LAB — CBC WITH DIFFERENTIAL/PLATELET
Basophils Absolute: 0.1 x10E3/uL (ref 0.0–0.2)
Basos: 1 %
EOS (ABSOLUTE): 0.4 x10E3/uL (ref 0.0–0.4)
Eos: 6 %
Hematocrit: 37.2 % (ref 34.0–46.6)
Hemoglobin: 12.6 g/dL (ref 11.1–15.9)
Immature Grans (Abs): 0 x10E3/uL (ref 0.0–0.1)
Immature Granulocytes: 0 %
Lymphocytes Absolute: 2.1 x10E3/uL (ref 0.7–3.1)
Lymphs: 30 %
MCH: 30 pg (ref 26.6–33.0)
MCHC: 33.9 g/dL (ref 31.5–35.7)
MCV: 89 fL (ref 79–97)
Monocytes Absolute: 0.4 x10E3/uL (ref 0.1–0.9)
Monocytes: 6 %
Neutrophils Absolute: 4.2 x10E3/uL (ref 1.4–7.0)
Neutrophils: 57 %
Platelets: 285 x10E3/uL (ref 150–379)
RBC: 4.2 x10E6/uL (ref 3.77–5.28)
RDW: 13.6 % (ref 12.3–15.4)
WBC: 7.2 x10E3/uL (ref 3.4–10.8)

## 2015-09-30 LAB — LIPID PANEL
Chol/HDL Ratio: 3.2 ratio units (ref 0.0–4.4)
Cholesterol, Total: 150 mg/dL (ref 100–199)
HDL: 47 mg/dL (ref >39)
LDL Calculated: 79 mg/dL (ref 0–99)
Triglycerides: 118 mg/dL (ref 0–149)
VLDL Cholesterol Cal: 24 mg/dL (ref 5–40)

## 2015-09-30 LAB — HEMOGLOBIN A1C
Est. average glucose Bld gHb Est-mCnc: 128 mg/dL
Hgb A1c MFr Bld: 6.1 % — ABNORMAL HIGH (ref 4.8–5.6)

## 2015-09-30 LAB — COMPREHENSIVE METABOLIC PANEL
ALT: 10 IU/L (ref 0–32)
AST: 14 IU/L (ref 0–40)
Albumin/Globulin Ratio: 1.6 (ref 1.2–2.2)
Albumin: 3.9 g/dL (ref 3.5–5.5)
Alkaline Phosphatase: 90 IU/L (ref 39–117)
BUN/Creatinine Ratio: 18 (ref 9–23)
BUN: 14 mg/dL (ref 6–24)
Bilirubin Total: 0.5 mg/dL (ref 0.0–1.2)
CO2: 26 mmol/L (ref 18–29)
Calcium: 9.2 mg/dL (ref 8.7–10.2)
Chloride: 101 mmol/L (ref 96–106)
Creatinine, Ser: 0.8 mg/dL (ref 0.57–1.00)
GFR calc Af Amer: 95 mL/min/{1.73_m2} (ref >59)
GFR calc non Af Amer: 83 mL/min/{1.73_m2} (ref >59)
Globulin, Total: 2.5 g/dL (ref 1.5–4.5)
Glucose: 93 mg/dL (ref 65–99)
Potassium: 4.4 mmol/L (ref 3.5–5.2)
Sodium: 141 mmol/L (ref 134–144)
Total Protein: 6.4 g/dL (ref 6.0–8.5)

## 2015-10-01 ENCOUNTER — Ambulatory Visit: Payer: Managed Care, Other (non HMO) | Admitting: Internal Medicine

## 2015-10-03 ENCOUNTER — Ambulatory Visit: Payer: Managed Care, Other (non HMO) | Admitting: Internal Medicine

## 2015-10-06 ENCOUNTER — Ambulatory Visit: Payer: Managed Care, Other (non HMO) | Admitting: Internal Medicine

## 2015-10-06 ENCOUNTER — Encounter: Payer: Self-pay | Admitting: Internal Medicine

## 2015-10-06 ENCOUNTER — Ambulatory Visit (INDEPENDENT_AMBULATORY_CARE_PROVIDER_SITE_OTHER): Payer: Managed Care, Other (non HMO) | Admitting: Internal Medicine

## 2015-10-06 VITALS — BP 116/68 | HR 72 | Resp 12 | Ht 63.0 in | Wt 152.0 lb

## 2015-10-06 DIAGNOSIS — Z1159 Encounter for screening for other viral diseases: Secondary | ICD-10-CM | POA: Diagnosis not present

## 2015-10-06 DIAGNOSIS — I129 Hypertensive chronic kidney disease with stage 1 through stage 4 chronic kidney disease, or unspecified chronic kidney disease: Secondary | ICD-10-CM

## 2015-10-06 DIAGNOSIS — N049 Nephrotic syndrome with unspecified morphologic changes: Secondary | ICD-10-CM

## 2015-10-06 DIAGNOSIS — Z114 Encounter for screening for human immunodeficiency virus [HIV]: Secondary | ICD-10-CM

## 2015-10-06 DIAGNOSIS — R739 Hyperglycemia, unspecified: Secondary | ICD-10-CM

## 2015-10-06 NOTE — Progress Notes (Signed)
Patient ID: Cassandra HurtChristy B Pineda, female   DOB: March 01, 1961, 55 y.o.   MRN: [id number]    [facility] Provider:  Jenavee Laguardia L. Renato Gailseed, D.O., C.M.D.  Code Status: full code  Goals of Care:  Advanced Directives 10/06/2015  Does patient have an advance directive? No  Type of Advance Directive -  Does patient want to make changes to advanced directive? -  Copy of advanced directive(s) in chart? -  Would patient like information on creating an advanced directive? Yes - Lexicographerducational materials given     Chief Complaint  Patient presents with  . Medical Management of Chronic Issues    6 month follow-up, discuss labs (copy printed)     HPI: Patient is a 55 y.o. female seen today for medical management of chronic diseases.    Has cut down on a lot on her sweets.  Some days she might even forget her losartan and it's (BP) no different w/o it.  hba1c stable at 6.1.  Keeps her grandbaby and they walk and play.  She is chasing him around crawling on the floor.  More active those days than when she is working  Mood is good.  No problems there.    Has pap 5/11 and plans to schedule mammo tomorrow.  Past Medical History  Diagnosis Date  . Hypertension, renal disease     HTN related to a kidney disorder  . Renal disorder   . Depression   . Hyperlipidemia LDL goal < 100   . Nephrotic syndrome   . Anxiety   . Hypertension     Past Surgical History  Procedure Laterality Date  . Renal biopsy  2013    Annie SableKellie [place]  . Tubal ligation  1993    Allergies  Allergen Reactions  . Ace Inhibitors Cough      Medication List       This list is accurate as of: 10/06/15  2:43 PM.  Always use your most recent med list.               acetaminophen 325 MG tablet  Commonly known as:  TYLENOL  Take 650 mg by mouth every 6 (six) hours as needed.     ALPRAZolam 0.5 MG tablet  Commonly known as:  XANAX  TAKE 1 TABLET BY MOUTH EVERY 8 HOURS AS NEEDED FOR ANXIETY     citalopram 20 MG  tablet  Commonly known as:  CELEXA  TAKE 1 TABLET (20 MG TOTAL) BY MOUTH DAILY.     losartan 100 MG tablet  Commonly known as:  COZAAR  Take 100 mg by mouth daily.     multivitamin tablet  Take 1 tablet by mouth daily.     simvastatin 20 MG tablet  Commonly known as:  ZOCOR  Take 1 tablet (20 mg total) by mouth at bedtime.     VENTOLIN HFA 108 (90 Base) MCG/ACT inhaler  Generic drug:  albuterol  INHALE 2 PUFFS INTO THE LUNGS EVERY 6 (SIX) HOURS AS NEEDED FOR WHEEZING OR SHORTNESS OF BREATH.        Review of Systems:  Review of Systems  Constitutional: Negative for fever and chills.  HENT: Negative for congestion.   Eyes: Negative for blurred vision.       Glasses  Respiratory: Negative for cough and shortness of breath.   Cardiovascular: Negative for chest pain and leg swelling.  Gastrointestinal: Negative for abdominal pain and constipation.  Genitourinary: Negative for dysuria.  Musculoskeletal: Negative for falls.  Skin: Negative for rash.  Neurological: Negative for dizziness and loss of consciousness.  Psychiatric/Behavioral: Negative for depression and memory loss. The patient is not nervous/anxious.     Health Maintenance  Topic Date Due  . Hepatitis C Screening  1960-10-04  . HIV Screening  06/19/1974  . INFLUENZA VACCINE  02/03/2016  . MAMMOGRAM  11/10/2016  . PAP SMEAR  11/03/2017  . TETANUS/TDAP  06/19/2023  . COLONOSCOPY  08/25/2023    Physical Exam: Filed Vitals:   10/06/15 1428  BP: 116/68  Pulse: 72  Resp: 12  Height:  (1.6 m)  Weight: 152 lb (68.947 kg)   Body mass index is 26.93 kg/(m^2). Physical Exam  Constitutional: She is oriented to person, place, and time. She appears well-developed and well-nourished.  Cardiovascular: Normal rate, regular rhythm, normal heart sounds and intact distal pulses.   Pulmonary/Chest: Effort normal and breath sounds normal. No respiratory distress.  Abdominal: Bowel sounds are normal.    Musculoskeletal: Normal range of motion.  Neurological: She is alert and oriented to person, place, and time.  Skin: Skin is warm and dry.  Psychiatric: She has a normal mood and affect.    Labs reviewed: Basic Metabolic Panel:  Recent Labs  16/10/96 0805 09/29/15 0812  NA 142 141  K 4.5 4.4  CL 102 101  CO2 23 26  GLUCOSE 101* 93  BUN 18 14  CREATININE 1.00 0.80  CALCIUM 9.1 9.2   Liver Function Tests:  Recent Labs  02/27/15 0805 09/29/15 0812  AST 13 14  ALT 11 10  ALKPHOS 93 90  BILITOT 0.5 0.5  PROT 6.6 6.4  ALBUMIN 4.2 3.9   No results for input(s): LIPASE, AMYLASE in the last 8760 hours. No results for input(s): AMMONIA in the last 8760 hours. CBC:  Recent Labs  02/27/15 0805 09/29/15 0812  WBC 7.7 7.2  NEUTROABS 4.3 4.2  HCT 35.6 37.2  MCV 90 89  PLT 286 285   Lipid Panel:  Recent Labs  02/27/15 0805 09/29/15 0812  CHOL 161 150  HDL 50 47  LDLCALC 85 79  TRIG 129 118  CHOLHDL 3.2 3.2   Lab Results  Component Value Date   HGBA1C 6.1* 09/29/2015    Assessment/Plan 1. Nephrotic syndrome - labs all great - no edema, no difficulties whatsoever - Lipid panel; Future - Basic metabolic panel; Future - CBC with Differential/Platelet; Future  2. Hyperglycemia - stable at 6.1, cont diet and exercise - Hemoglobin A1c; Future  3. Hypertension, renal disease, stage 1-4 or unspecified chronic kidney disease (HCC) -bp definitely at goal--? If cozaar dose can be reduced, bt depends on nephrologist - Lipid panel; Future - Basic metabolic panel; Future - CBC with Differential/Platelet; Future  4. Need for hepatitis C screening test - Hep C Antibody checked today due to birthdate  5. Encounter for screening for HIV - HIV antibody (with reflex) checked today due to birthdate  Labs/tests ordered:   Orders Placed This Encounter  Procedures  . HIV antibody (with reflex)  . Hep C Antibody  . Hemoglobin A1c    Standing Status: Future      Number of Occurrences:      Standing Expiration Date: 10/05/2016  . Lipid panel    Standing Status: Future     Number of Occurrences:      Standing Expiration Date: 10/05/2016    Order Specific Question:  Has the patient fasted?    Answer:  Yes  . Basic metabolic panel  Standing Status: Future     Number of Occurrences:      Standing Expiration Date: 10/05/2016    Order Specific Question:  Has the patient fasted?    Answer:  Yes  . CBC with Differential/Platelet    Standing Status: Future     Number of Occurrences:      Standing Expiration Date: 10/05/2016   Next appt:  6 mos for annual with labs before    Samil Mecham L. Rudolf Blizard, D.O. Geriatrics [facility] [address] [place], [place] [address] Cell Phone (Mon-Fri 8am-5pm):  [phone] On Call:  [phone] & follow prompts after 5pm & weekends Office Phone:  [phone] Office Fax:  [phone]

## 2015-10-07 LAB — HEPATITIS C ANTIBODY: Hep C Virus Ab: <0.1 s/co ratio (ref 0.0–0.9)

## 2015-10-07 LAB — HIV ANTIBODY (ROUTINE TESTING W REFLEX): HIV Screen 4th Generation wRfx: NONREACTIVE

## 2015-10-20 ENCOUNTER — Other Ambulatory Visit: Payer: Self-pay | Admitting: Adult Health

## 2015-10-20 DIAGNOSIS — Z1231 Encounter for screening mammogram for malignant neoplasm of breast: Secondary | ICD-10-CM

## 2015-11-13 ENCOUNTER — Other Ambulatory Visit: Payer: Managed Care, Other (non HMO) | Admitting: Adult Health

## 2015-11-14 ENCOUNTER — Ambulatory Visit (INDEPENDENT_AMBULATORY_CARE_PROVIDER_SITE_OTHER): Payer: Managed Care, Other (non HMO) | Admitting: Adult Health

## 2015-11-14 ENCOUNTER — Ambulatory Visit (HOSPITAL_COMMUNITY)
Admission: RE | Admit: 2015-11-14 | Discharge: 2015-11-14 | Disposition: A | Discharge status: Home / Self Care | Payer: Managed Care, Other (non HMO) | Source: Ambulatory Visit | Attending: Adult Health | Admitting: Adult Health

## 2015-11-14 ENCOUNTER — Encounter: Payer: Self-pay | Admitting: Adult Health

## 2015-11-14 VITALS — BP 124/80 | HR 84 | Ht 63.0 in | Wt 151.0 lb

## 2015-11-14 DIAGNOSIS — Z1212 Encounter for screening for malignant neoplasm of rectum: Secondary | ICD-10-CM | POA: Diagnosis not present

## 2015-11-14 DIAGNOSIS — Z01419 Encounter for gynecological examination (general) (routine) without abnormal findings: Secondary | ICD-10-CM | POA: Diagnosis not present

## 2015-11-14 DIAGNOSIS — Z1231 Encounter for screening mammogram for malignant neoplasm of breast: Secondary | ICD-10-CM | POA: Insufficient documentation

## 2015-11-14 LAB — HEMOCCULT GUIAC POC 1CARD (OFFICE): FECAL OCCULT BLD: NEGATIVE

## 2015-11-14 NOTE — Patient Instructions (Signed)
Physical in 1 year, pap in 2019 Mammogram yearly Colonoscopy per GI Labs per PCP

## 2015-11-14 NOTE — Progress Notes (Signed)
Patient ID: Cassandra Pineda, female   DOB: 06/02/1961, 55 y.o.   MRN: [id number] History of Present Illness: Cassandra Pineda is a 55 year old white female, married in for well woman gyn exam, she had a normal pap with negative HPV 11/04/14. PCP is Dr Bufford Spikesiffany Reed.  Current Medications, Allergies, Past Medical History, Past Surgical History, Family History and Social History were reviewed in [facility] electronic medical record.     Review of Systems: Patient denies any headaches, hearing loss, fatigue, blurred vision, shortness of breath, chest pain, abdominal pain, problems with bowel movements, urination, or intercourse. No joint pain or mood swings.    Physical Exam:BP 124/80 mmHg  Pulse 84  Ht 5\' 3"  (1.6 m)  Wt 151 lb (68.493 kg)  BMI 26.76 kg/m2 General:  Well developed, well nourished, no acute distress Skin:  Warm and dry, no rashes Neck:  Midline trachea, normal thyroid, good ROM, no lymphadenopathy Lungs; Clear to auscultation bilaterally Breast:  No dominant palpable mass, retraction, or nipple discharge Cardiovascular: Regular rate and rhythm Abdomen:  Soft, non tender, no hepatosplenomegaly Pelvic:  External genitalia is normal in appearance, no lesions.  The vagina has decreased color, moisture and rugae. Urethra has no lesions or masses. The cervix is smooth.  Uterus is felt to be normal size, shape, and contour.  No adnexal masses or tenderness noted.Bladder is non tender, no masses felt. Rectal: Good sphincter tone, no polyps, or hemorrhoids felt.  Hemoccult negative. Extremities/musculoskeletal:  No swelling or varicosities noted, no clubbing or cyanosis Psych:  No mood changes, alert and cooperative,seems happy   Impression:  Well woman gyn exam no pap   Plan: Physical in 1 year, pap in 2019 Mammogram yearly Colonoscopy per GI Labs with PCP

## 2015-12-18 ENCOUNTER — Other Ambulatory Visit: Payer: Self-pay | Admitting: *Deleted

## 2015-12-18 MED ORDER — SIMVASTATIN 20 MG PO TABS: 20.0000 mg | ORAL_TABLET | Freq: Every day | ORAL | Status: DC

## 2016-02-20 NOTE — Addendum Note (Signed)
Addended by: Daevon Holdren A on: 02/20/2016 04:09 PM   Modules accepted: Orders  

## 2016-02-27 ENCOUNTER — Ambulatory Visit (INDEPENDENT_AMBULATORY_CARE_PROVIDER_SITE_OTHER): Payer: Managed Care, Other (non HMO) | Admitting: Internal Medicine

## 2016-02-27 ENCOUNTER — Encounter: Payer: Self-pay | Admitting: Internal Medicine

## 2016-02-27 VITALS — BP 120/70 | HR 54 | Temp 98.4°F | Wt 154.0 lb

## 2016-02-27 DIAGNOSIS — M792 Neuralgia and neuritis, unspecified: Secondary | ICD-10-CM | POA: Diagnosis not present

## 2016-02-27 NOTE — Progress Notes (Signed)
Location:  [facility] Provider: Trenden Hazelrigg L. Renato Gails, D.O., C.M.D.  Code Status: full code Goals of Care:  Advanced Directives 02/27/2016  Does patient have an advance directive? No  Type of Advance Directive -  Does patient want to make changes to advanced directive? -  Copy of advanced directive(s) in chart? -  Would patient like information on creating an advanced directive? No - patient declined information    Chief Complaint  Patient presents with  . Acute Visit    pain on left side x4 days    HPI: Patient is a 55 y.o. female seen today for an acute visit for pain on her left side for 4 days.  There was concern she could be developing shingles.  It's numb and tingling and pain shooting through it.  Couldn't put anything on it last night.  Now able to dress today.  Says she had a similar pain under her breast on the right a few years ago.  She has been working.  Ibuprofen did ease it up.  Past Medical History:  Diagnosis Date  . Anxiety   . Depression   . Hyperlipidemia LDL goal < 100   . Hypertension   . Hypertension, renal disease    HTN related to a kidney disorder  . Nephrotic syndrome   . Renal disorder     Past Surgical History:  Procedure Laterality Date  . RENAL BIOPSY  2013   [name]  . TUBAL LIGATION  1993    Allergies  Allergen Reactions  . Ace Inhibitors Cough      Medication List       Accurate as of 02/27/16  8:57 AM. Always use your most recent med list.          acetaminophen 325 MG tablet Commonly known as:  TYLENOL Take 650 mg by mouth every 6 (six) hours as needed.   ALPRAZolam 0.5 MG tablet Commonly known as:  XANAX TAKE 1 TABLET BY MOUTH EVERY 8 HOURS AS NEEDED FOR ANXIETY   citalopram 20 MG tablet Commonly known as:  CELEXA TAKE 1 TABLET (20 MG TOTAL) BY MOUTH DAILY.   losartan 100 MG tablet Commonly known as:  COZAAR Take 50 mg by mouth daily.   multivitamin tablet Take 1 tablet by mouth daily.   simvastatin  20 MG tablet Commonly known as:  ZOCOR Take 1 tablet (20 mg total) by mouth at bedtime.   VENTOLIN HFA 108 (90 Base) MCG/ACT inhaler Generic drug:  albuterol INHALE 2 PUFFS INTO THE LUNGS EVERY 6 (SIX) HOURS AS NEEDED FOR WHEEZING OR SHORTNESS OF BREATH.       Review of Systems:  Review of Systems  Constitutional: Negative for chills, fever and malaise/fatigue.  Skin: Negative for itching and rash.       Left waist line numbness and burning tingling pain, no rash, worse at night and not as bad in days  Neurological: Negative for weakness.    Health Maintenance  Topic Date Due  . INFLUENZA VACCINE  02/03/2016  . PAP SMEAR  11/03/2017  . MAMMOGRAM  11/13/2017  . TETANUS/TDAP  06/19/2023  . COLONOSCOPY  08/25/2023  . Hepatitis C Screening  Completed  . HIV Screening  Completed    Physical Exam: Vitals:   02/27/16 0853  BP: 120/70  Pulse: (!) 54  Temp: 98.4 F (36.9 C)  TempSrc: Oral  SpO2: 95%  Weight: 154 lb (69.9 kg)   Body mass index is 27.28 kg/m. Physical Exam  Constitutional: She is oriented to person, place, and time. She appears well-developed and well-nourished.  Musculoskeletal: Normal range of motion.  Neurological: She is alert and oriented to person, place, and time.  Diminished sensation to left side at T11 dermatome  Skin: Skin is warm and dry. No rash noted. No erythema.    Labs reviewed: Basic Metabolic Panel:  Recent Labs  16/04/9602/27/17 0812  NA 141  K 4.4  CL 101  CO2 26  GLUCOSE 93  BUN 14  CREATININE 0.80  CALCIUM 9.2   Liver Function Tests:  Recent Labs  09/29/15 0812  AST 14  ALT 10  ALKPHOS 90  BILITOT 0.5  PROT 6.4  ALBUMIN 3.9   No results for input(s): LIPASE, AMYLASE in the last 8760 hours. No results for input(s): AMMONIA in the last 8760 hours. CBC:  Recent Labs  09/29/15 0812  WBC 7.2  NEUTROABS 4.2  HCT 37.2  MCV 89  PLT 285   Lipid Panel:  Recent Labs  09/29/15 0812  CHOL 150  HDL 47  LDLCALC 79   TRIG 045118  CHOLHDL 3.2   Lab Results  Component Value Date   HGBA1C 6.1 (H) 09/29/2015    Assessment/Plan 1. Pain, neuropathic -Of T11 dermatome, but no rash and has had this feeling for 4 days now -Is afebrile, no myalgias or other signs of pending infection/zoster -suspect related to her waistline of her pants/belt putting pressure on the area  -she will call back if rash develops or pain bothersome enough to require treatment   Labs/tests ordered:  No new Next appt:  04/05/2016  Denny Lave L. Fonda Rochon, D.O. Geriatrics [facility] [address] [place], [place] [address] Cell Phone (Mon-Fri 8am-5pm):  [phone] On Call:  [phone] & follow prompts after 5pm & weekends Office Phone:  [phone] Office Fax:  [phone]

## 2016-03-02 ENCOUNTER — Telehealth: Payer: Self-pay | Admitting: *Deleted

## 2016-03-02 DIAGNOSIS — B029 Zoster without complications: Secondary | ICD-10-CM

## 2016-03-02 MED ORDER — VALACYCLOVIR HCL 1 G PO TABS: 1000.0000 mg | ORAL_TABLET | Freq: Two times a day (BID) | ORAL | 0 refills | Status: AC

## 2016-03-02 MED ORDER — GABAPENTIN 100 MG PO CAPS: 100.0000 mg | ORAL_CAPSULE | Freq: Three times a day (TID) | ORAL | 0 refills | Status: DC

## 2016-03-02 NOTE — Telephone Encounter (Signed)
Patient called and stated that the pain in the side is no better and getting worse. Stated that the Tylenol is not helping. Has a blister in that area now. Thinking Shingles and would like something for this. Please Advise.

## 2016-03-02 NOTE — Telephone Encounter (Signed)
LMOM for patient to return call.

## 2016-03-02 NOTE — Telephone Encounter (Signed)
Patient notified and agreed.  

## 2016-03-15 ENCOUNTER — Ambulatory Visit (INDEPENDENT_AMBULATORY_CARE_PROVIDER_SITE_OTHER): Payer: Managed Care, Other (non HMO) | Admitting: Internal Medicine

## 2016-03-15 ENCOUNTER — Encounter: Payer: Self-pay | Admitting: Internal Medicine

## 2016-03-15 VITALS — BP 120/70 | HR 81 | Temp 98.1°F | Wt 155.0 lb

## 2016-03-15 DIAGNOSIS — M792 Neuralgia and neuritis, unspecified: Secondary | ICD-10-CM | POA: Diagnosis not present

## 2016-03-15 DIAGNOSIS — B029 Zoster without complications: Secondary | ICD-10-CM | POA: Diagnosis not present

## 2016-03-15 NOTE — Progress Notes (Signed)
[facility] Provider: Shekinah Pitones L. Renato Gailseed, D.O., C.M.D.  Code Status: full code Goals of Care:  Advanced Directives 02/27/2016  Does patient have an advance directive? No  Type of Advance Directive -  Does patient want to make changes to advanced directive? -  Copy of advanced directive(s) in chart? -  Would patient like information on creating an advanced directive? No - patient declined information   Chief Complaint  Patient presents with  . Acute Visit    swelling on left side    HPI: Patient is a 55 y.o. female seen today for an acute visit for swelling of her left side.  She only got one little blister.  She has swelling in the same area--more prominence on her left side of her abdomen over the same dermatomal distribution.  No pain now, rare sharp pain and tingles if she lays on it.  Completed the course of valtrex.  Has used some gabapentin as needed which does help.    Past Medical History:  Diagnosis Date  . Anxiety   . Depression   . Hyperlipidemia LDL goal < 100   . Hypertension   . Hypertension, renal disease    HTN related to a kidney disorder  . Nephrotic syndrome   . Renal disorder     Past Surgical History:  Procedure Laterality Date  . RENAL BIOPSY  2013   Annie SableKellie Goldsborough  . TUBAL LIGATION  1993    Allergies  Allergen Reactions  . Ace Inhibitors Cough      Medication List       Accurate as of 03/15/16  2:50 PM. Always use your most recent med list.          acetaminophen 325 MG tablet Commonly known as:  TYLENOL Take 650 mg by mouth every 6 (six) hours as needed.   ALPRAZolam 0.5 MG tablet Commonly known as:  XANAX TAKE 1 TABLET BY MOUTH EVERY 8 HOURS AS NEEDED FOR ANXIETY   citalopram 20 MG tablet Commonly known as:  CELEXA TAKE 1 TABLET (20 MG TOTAL) BY MOUTH DAILY.   gabapentin 100 MG capsule Commonly known as:  NEURONTIN Take 1 capsule (100 mg total) by mouth 3 (three) times daily.   losartan 100 MG tablet Commonly  known as:  COZAAR Take 50 mg by mouth daily.   multivitamin tablet Take 1 tablet by mouth daily.   simvastatin 20 MG tablet Commonly known as:  ZOCOR Take 1 tablet (20 mg total) by mouth at bedtime.   VENTOLIN HFA 108 (90 Base) MCG/ACT inhaler Generic drug:  albuterol INHALE 2 PUFFS INTO THE LUNGS EVERY 6 (SIX) HOURS AS NEEDED FOR WHEEZING OR SHORTNESS OF BREATH.       Review of Systems:  Review of Systems  Constitutional: Negative for chills and fever.  Gastrointestinal: Negative for abdominal pain, blood in stool, constipation, diarrhea, heartburn, melena, nausea and vomiting.  Skin: Negative for itching and rash.       Had vesicle that is all healed up around upper pantline  Neurological: Positive for tingling and sensory change.       Left lower abdomen dermatomal area    Health Maintenance  Topic Date Due  . INFLUENZA VACCINE  02/03/2016  . PAP SMEAR  11/03/2017  . MAMMOGRAM  11/13/2017  . TETANUS/TDAP  06/19/2023  . COLONOSCOPY  08/25/2023  . Hepatitis C Screening  Completed  . HIV Screening  Completed    Physical Exam: Vitals:   03/15/16 1434  BP: 120/70  Pulse: 81  Temp: 98.1 F (36.7 C)  TempSrc: Oral  SpO2: 95%  Weight: 155 lb (70.3 kg)   Body mass index is 27.46 kg/m. Physical Exam  Constitutional: She is oriented to person, place, and time. She appears well-developed and well-nourished. No distress.  HENT:  Head: Normocephalic and atraumatic.  Cardiovascular: Normal rate and regular rhythm.   Pulmonary/Chest: Effort normal.  Musculoskeletal: Normal range of motion.  Neurological: She is alert and oriented to person, place, and time. Coordination normal.  Skin: Skin is warm and dry.  Scar from one vesicle at left upper pantline; slight swelling of left side of abdomen vs right, but no other vesicles or rash  Psychiatric: She has a normal mood and affect.    Labs reviewed: Basic Metabolic Panel:  Recent Labs  16/10/96 0812  NA 141  K  4.4  CL 101  CO2 26  GLUCOSE 93  BUN 14  CREATININE 0.80  CALCIUM 9.2   Liver Function Tests:  Recent Labs  09/29/15 0812  AST 14  ALT 10  ALKPHOS 90  BILITOT 0.5  PROT 6.4  ALBUMIN 3.9   No results for input(s): LIPASE, AMYLASE in the last 8760 hours. No results for input(s): AMMONIA in the last 8760 hours. CBC:  Recent Labs  09/29/15 0812  WBC 7.2  NEUTROABS 4.2  HCT 37.2  MCV 89  PLT 285   Lipid Panel:  Recent Labs  09/29/15 0812  CHOL 150  HDL 47  LDLCALC 79  TRIG 118  CHOLHDL 3.2   Lab Results  Component Value Date   HGBA1C 6.1 (H) 09/29/2015    Assessment/Plan 1. 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 Pain, neuropathic -due to zoster -cont gabapentin, but use more regularly if having pain  Labs/tests ordered:  No new Next appt:  04/05/2016  Susana Duell L. Reynald Woods, D.O. Geriatrics [facility] [address], [place] [address] Cell Phone (Mon-Fri 8am-5pm):  [phone] On Call:  [phone] & follow prompts after 5pm & weekends Office Phone:  [phone] Office Fax:  [phone]

## 2016-03-15 NOTE — Patient Instructions (Signed)
Use the gabapentin in the pain or swelling worsens.  You don't need more shingles medicine.

## 2016-03-26 ENCOUNTER — Telehealth: Payer: Self-pay

## 2016-03-26 NOTE — Telephone Encounter (Signed)
Called patient lmovm to see if interested in reschedule lab appointment from 04-05-16 to earlier on  04-06-16 (due to trying to balance out amount of patient on same day).

## 2016-03-28 ENCOUNTER — Other Ambulatory Visit: Payer: Self-pay | Admitting: Internal Medicine

## 2016-03-28 DIAGNOSIS — B029 Zoster without complications: Secondary | ICD-10-CM

## 2016-04-05 ENCOUNTER — Other Ambulatory Visit: Payer: Managed Care, Other (non HMO)

## 2016-04-05 DIAGNOSIS — I129 Hypertensive chronic kidney disease with stage 1 through stage 4 chronic kidney disease, or unspecified chronic kidney disease: Secondary | ICD-10-CM

## 2016-04-05 DIAGNOSIS — R739 Hyperglycemia, unspecified: Secondary | ICD-10-CM

## 2016-04-05 DIAGNOSIS — N049 Nephrotic syndrome with unspecified morphologic changes: Secondary | ICD-10-CM

## 2016-04-05 LAB — CBC WITH DIFFERENTIAL/PLATELET
Basophils Absolute: 0 cells/uL (ref 0–200)
Basophils Relative: 0 %
Eosinophils Absolute: 308 cells/uL (ref 15–500)
Eosinophils Relative: 4 %
HCT: 37 % (ref 35.0–45.0)
Hemoglobin: 12.5 g/dL (ref 11.7–15.5)
Lymphocytes Relative: 29 %
Lymphs Abs: 2233 cells/uL (ref 850–3900)
MCH: 30.1 pg (ref 27.0–33.0)
MCHC: 33.8 g/dL (ref 32.0–36.0)
MCV: 89.2 fL (ref 80.0–100.0)
MPV: 9.3 fL (ref 7.5–12.5)
Monocytes Absolute: 693 cells/uL (ref 200–950)
Monocytes Relative: 9 %
Neutro Abs: 4466 cells/uL (ref 1500–7800)
Neutrophils Relative %: 58 %
Platelets: 293 10*3/uL (ref 140–400)
RBC: 4.15 MIL/uL (ref 3.80–5.10)
RDW: 13.2 % (ref 11.0–15.0)
WBC: 7.7 10*3/uL (ref 3.8–10.8)

## 2016-04-05 LAB — LIPID PANEL
Cholesterol: 157 mg/dL (ref 125–200)
HDL: 44 mg/dL — ABNORMAL LOW (ref >=46)
LDL Cholesterol: 88 mg/dL (ref <130)
Total CHOL/HDL Ratio: 3.6 Ratio (ref <=5.0)
Triglycerides: 124 mg/dL (ref <150)
VLDL: 25 mg/dL (ref <30)

## 2016-04-05 LAB — BASIC METABOLIC PANEL
BUN: 17 mg/dL (ref 7–25)
CO2: 26 mmol/L (ref 20–31)
Calcium: 9.2 mg/dL (ref 8.6–10.4)
Chloride: 105 mmol/L (ref 98–110)
Creat: 0.85 mg/dL (ref 0.50–1.05)
Glucose, Bld: 93 mg/dL (ref 65–99)
Potassium: 4.4 mmol/L (ref 3.5–5.3)
Sodium: 139 mmol/L (ref 135–146)

## 2016-04-06 LAB — HEMOGLOBIN A1C
Hgb A1c MFr Bld: 5.8 % — ABNORMAL HIGH (ref <5.7)
Mean Plasma Glucose: 120 mg/dL

## 2016-04-08 ENCOUNTER — Encounter: Payer: Self-pay | Admitting: *Deleted

## 2016-04-08 ENCOUNTER — Encounter: Payer: Self-pay | Admitting: Internal Medicine

## 2016-04-08 ENCOUNTER — Ambulatory Visit (INDEPENDENT_AMBULATORY_CARE_PROVIDER_SITE_OTHER): Payer: Managed Care, Other (non HMO) | Admitting: Internal Medicine

## 2016-04-08 VITALS — BP 138/80 | HR 63 | Temp 98.0°F | Ht 63.0 in | Wt 155.0 lb

## 2016-04-08 DIAGNOSIS — B029 Zoster without complications: Secondary | ICD-10-CM | POA: Diagnosis not present

## 2016-04-08 DIAGNOSIS — F419 Anxiety disorder, unspecified: Secondary | ICD-10-CM | POA: Diagnosis not present

## 2016-04-08 DIAGNOSIS — R739 Hyperglycemia, unspecified: Secondary | ICD-10-CM | POA: Diagnosis not present

## 2016-04-08 DIAGNOSIS — Z0000 Encounter for general adult medical examination without abnormal findings: Secondary | ICD-10-CM

## 2016-04-08 DIAGNOSIS — E782 Mixed hyperlipidemia: Secondary | ICD-10-CM

## 2016-04-08 DIAGNOSIS — I129 Hypertensive chronic kidney disease with stage 1 through stage 4 chronic kidney disease, or unspecified chronic kidney disease: Secondary | ICD-10-CM

## 2016-04-08 NOTE — Progress Notes (Addendum)
Provider:  Gwenith Spitz. Renato Gails, D.O., C.M.D. Location:   [facility]   Place of Service:   Clinic  Previous PCP: Bufford Spikes, DO Patient Care Team: Kermit Balo, DO as PCP - General (Geriatric Medicine)  Extended Emergency Contact Information Primary Emergency Contact: Kueker,Don Address: [address]          [place], [place] Home Phone: [phone] Work Phone: [phone] Relation: Spouse  Code Status: full code Goals of Care: Advanced Directive information Advanced Directives 02/27/2016  Does patient have an advance directive? No  Type of Advance Directive -  Does patient want to make changes to advanced directive? -  Copy of advanced directive(s) in chart? -  Would patient like information on creating an advanced directive? No - patient declined information   Chief Complaint  Patient presents with  . Annual Exam    physical exam    HPI: Patient is a 55 y.o. female seen today for an annual physical exam.    Pain resolved.  No longer having any trouble.    No concerns today.      Sugar average is better at 5.8 from 6.1.  Had cut back on her sweet tea.   Bad cholesterol crept up slightly but at goal.  Nerves are good.  No worries.  Says she has a lot of leaves in her yard and it is a lot to do.    Past Medical History:  Diagnosis Date  . Anxiety   . Depression   . Hyperlipidemia LDL goal < 100   . Hypertension   . Hypertension, renal disease    HTN related to a kidney disorder  . Nephrotic syndrome   . Renal disorder    Past Surgical History:  Procedure Laterality Date  . RENAL BIOPSY  2013   [name]  . TUBAL LIGATION  1993    reports that she quit smoking about 33 years ago. Her smoking use included Cigarettes. She has a 45.00 pack-year smoking history. She has never used smokeless tobacco. She reports that she does not drink alcohol or use drugs.  Functional Status Survey:  independent in all ADLs and IADLs  Family  History  Problem Relation Age of Onset  . Heart disease Mother 73  . Parkinson's disease Father   . Asthma Sister   . Hypertension Sister   . Lymphoma Sister 62  . Hypertension Brother   . Heart disease Brother 45    heart attack  . Hypertension Sister   . Hypertension Brother   . Colon cancer Neg Hx     Health Maintenance  Topic Date Due  . INFLUENZA VACCINE  02/03/2016  . PAP SMEAR  11/03/2017  . MAMMOGRAM  11/13/2017  . TETANUS/TDAP  06/19/2023  . COLONOSCOPY  08/25/2023  . Hepatitis C Screening  Completed  . HIV Screening  Completed    Allergies  Allergen Reactions  . Ace Inhibitors Cough      Medication List       Accurate as of 04/08/16  2:01 PM. Always use your most recent med list.          acetaminophen 325 MG tablet Commonly known as:  TYLENOL Take 650 mg by mouth every 6 (six) hours as needed.   ALPRAZolam 0.5 MG tablet Commonly known as:  XANAX TAKE 1 TABLET BY MOUTH EVERY 8 HOURS AS NEEDED FOR ANXIETY   citalopram 20 MG tablet Commonly known as:  CELEXA TAKE 1 TABLET (20 MG TOTAL)  BY MOUTH DAILY.   gabapentin 100 MG capsule Commonly known as:  NEURONTIN TAKE ONE CAPSULE BY MOUTH THREE TIMES A DAY   losartan 100 MG tablet Commonly known as:  COZAAR Take 50 mg by mouth daily.   multivitamin tablet Take 1 tablet by mouth daily.   simvastatin 20 MG tablet Commonly known as:  ZOCOR Take 1 tablet (20 mg total) by mouth at bedtime.   VENTOLIN HFA 108 (90 Base) MCG/ACT inhaler Generic drug:  albuterol INHALE 2 PUFFS INTO THE LUNGS EVERY 6 (SIX) HOURS AS NEEDED FOR WHEEZING OR SHORTNESS OF BREATH.       Review of Systems  Constitutional: Negative for chills and fever.  HENT: Negative for hearing loss.   Eyes: Negative for blurred vision.  Respiratory: Negative for shortness of breath.   Cardiovascular: Negative for chest pain, palpitations and leg swelling.  Gastrointestinal: Negative for abdominal pain, blood in stool, constipation,  diarrhea, heartburn, melena, nausea and vomiting.  Genitourinary: Negative for dysuria, frequency and urgency.  Musculoskeletal: Negative for falls, joint pain and myalgias.  Skin: Negative for itching and rash.  Neurological: Negative for dizziness, tingling, sensory change and loss of consciousness.  Endo/Heme/Allergies: Does not bruise/bleed easily.  Psychiatric/Behavioral: Negative for depression and memory loss. The patient is not nervous/anxious and does not have insomnia.     Vitals:   04/08/16 1342  BP: 138/80  Pulse: 63  Temp: 98 F (36.7 C)  TempSrc: Oral  SpO2: 97%  Weight: 155 lb (70.3 kg)  Height: 5\' 3"  (1.6 m)   Body mass index is 27.46 kg/m. Physical Exam  Constitutional: She is oriented to person, place, and time. She appears well-developed and well-nourished. No distress.  HENT:  Head: Normocephalic and atraumatic.  Right Ear: External ear normal.  Left Ear: External ear normal.  Nose: Nose normal.  Mouth/Throat: Oropharynx is clear and moist. No oropharyngeal exudate.  Eyes: Conjunctivae and EOM are normal. Pupils are equal, round, and reactive to light.  Neck: Neck supple. No JVD present.  Cardiovascular: Normal rate, regular rhythm, normal heart sounds and intact distal pulses.   Pulmonary/Chest: Effort normal and breath sounds normal. No respiratory distress.  Abdominal: Soft. Bowel sounds are normal. She exhibits no distension and no mass. There is no tenderness. There is no rebound and no guarding.  Musculoskeletal: Normal range of motion.  Lymphadenopathy:    She has no cervical adenopathy.  Neurological: She is alert and oriented to person, place, and time. She has normal reflexes.  Skin: Skin is warm and dry. Capillary refill takes less than 2 seconds.  Psychiatric: She has a normal mood and affect. Her behavior is normal. Judgment and thought content normal.    Labs reviewed: Basic Metabolic Panel:  Recent Labs  09/81/1903/27/17 0812 04/05/16 0917    NA 141 139  K 4.4 4.4  CL 101 105  CO2 26 26  GLUCOSE 93 93  BUN 14 17  CREATININE 0.80 0.85  CALCIUM 9.2 9.2   Liver Function Tests:  Recent Labs  09/29/15 0812  AST 14  ALT 10  ALKPHOS 90  BILITOT 0.5  PROT 6.4  ALBUMIN 3.9   No results for input(s): LIPASE, AMYLASE in the last 8760 hours. No results for input(s): AMMONIA in the last 8760 hours. CBC:  Recent Labs  09/29/15 0812 04/05/16 0917  WBC 7.2 7.7  NEUTROABS 4.2 4,466  HGB  --  12.5  HCT 37.2 37.0  MCV 89 89.2  PLT 285 293  Cardiac Enzymes: No results for input(s): CKTOTAL, CKMB, CKMBINDEX, TROPONINI in the last 8760 hours. BNP: Invalid input(s): POCBNP Lab Results  Component Value Date   HGBA1C 5.8 (H) 04/05/2016   Assessment/Plan 1. Annual physical exam - benign examination today, up to date on all except refuses flu shots - Hemoglobin A1c; Future - CBC with Differential/Platelet; Future - Comprehensive metabolic panel; Future - Lipid panel; Future  2. Hypertension, renal disease - bp controlled and kidneys wnl with current ARB therapy - Comprehensive metabolic panel; Future  3. Mixed hyperlipidemia - lipids at goal considering her overall good health - Lipid panel; Future  4. Hyperglycemia - sugar average improved, cont to monitor - Comprehensive metabolic panel; Future  5. Anxiety - cont celexa and prn xanax  6. Herpes zoster without complication - symptoms resolved after short term gabapentin and treatment with antiviral  Labs/tests ordered:   Orders Placed This Encounter  Procedures  . Hemoglobin A1c    Standing Status:   Future    Standing Expiration Date:   04/08/2018  . CBC with Differential/Platelet    Standing Status:   Future    Standing Expiration Date:   04/08/2018  . Comprehensive metabolic panel    Standing Status:   Future    Standing Expiration Date:   04/08/2018    Order Specific Question:   Has the patient fasted?    Answer:   Yes  . Lipid panel     Standing Status:   Future    Standing Expiration Date:   04/08/2018    Order Specific Question:   Has the patient fasted?    Answer:   Yes    Ivyana Locey L. Anjuli Gemmill, D.O. Geriatrics [facility] [address], [place] [address] Cell Phone (Mon-Fri 8am-5pm):  [phone] On Call:  [phone] & follow prompts after 5pm & weekends Office Phone:  [phone] Office Fax:  [phone]

## 2016-06-07 ENCOUNTER — Other Ambulatory Visit: Payer: Self-pay | Admitting: *Deleted

## 2016-06-07 MED ORDER — CITALOPRAM HYDROBROMIDE 20 MG PO TABS: ORAL_TABLET | ORAL | 2 refills | Status: DC

## 2016-07-26 ENCOUNTER — Other Ambulatory Visit: Payer: Self-pay | Admitting: Adult Health

## 2016-07-26 MED ORDER — ALPRAZOLAM 0.5 MG PO TABS: 0.5000 mg | ORAL_TABLET | Freq: Three times a day (TID) | ORAL | 0 refills | Status: DC | PRN

## 2016-10-18 ENCOUNTER — Other Ambulatory Visit: Payer: Self-pay | Admitting: Adult Health

## 2016-10-18 DIAGNOSIS — Z1231 Encounter for screening mammogram for malignant neoplasm of breast: Secondary | ICD-10-CM

## 2016-11-01 ENCOUNTER — Other Ambulatory Visit: Payer: Self-pay | Admitting: Adult Health

## 2016-11-01 ENCOUNTER — Telehealth: Payer: Self-pay | Admitting: Adult Health

## 2016-11-01 NOTE — Telephone Encounter (Signed)
She gets losartan from kidney doctor, will get pharmacy to call her,

## 2016-11-04 ENCOUNTER — Other Ambulatory Visit: Payer: Self-pay

## 2016-11-04 MED ORDER — LOSARTAN POTASSIUM 100 MG PO TABS: 50.0000 mg | ORAL_TABLET | Freq: Every day | ORAL | 1 refills | Status: DC

## 2016-11-07 ENCOUNTER — Telehealth: Payer: Managed Care, Other (non HMO) | Admitting: Family

## 2016-11-07 DIAGNOSIS — L237 Allergic contact dermatitis due to plants, except food: Secondary | ICD-10-CM

## 2016-11-07 MED ORDER — PREDNISONE 10 MG PO TABS: 10.0000 mg | ORAL_TABLET | ORAL | 0 refills | Status: DC

## 2016-11-07 NOTE — Progress Notes (Signed)

## 2016-11-15 ENCOUNTER — Ambulatory Visit (HOSPITAL_COMMUNITY)
Admission: RE | Admit: 2016-11-15 | Discharge: 2016-11-15 | Disposition: A | Discharge status: Home / Self Care | Payer: Managed Care, Other (non HMO) | Source: Ambulatory Visit | Attending: Adult Health | Admitting: Adult Health

## 2016-11-15 DIAGNOSIS — Z1231 Encounter for screening mammogram for malignant neoplasm of breast: Secondary | ICD-10-CM | POA: Diagnosis not present

## 2016-11-16 ENCOUNTER — Ambulatory Visit (INDEPENDENT_AMBULATORY_CARE_PROVIDER_SITE_OTHER): Payer: Managed Care, Other (non HMO) | Admitting: Adult Health

## 2016-11-16 ENCOUNTER — Encounter: Payer: Self-pay | Admitting: Adult Health

## 2016-11-16 VITALS — BP 128/66 | HR 73 | Ht 62.75 in | Wt 154.0 lb

## 2016-11-16 DIAGNOSIS — Z1212 Encounter for screening for malignant neoplasm of rectum: Secondary | ICD-10-CM | POA: Diagnosis not present

## 2016-11-16 DIAGNOSIS — Z01419 Encounter for gynecological examination (general) (routine) without abnormal findings: Secondary | ICD-10-CM | POA: Diagnosis not present

## 2016-11-16 DIAGNOSIS — F419 Anxiety disorder, unspecified: Secondary | ICD-10-CM

## 2016-11-16 DIAGNOSIS — Z1211 Encounter for screening for malignant neoplasm of colon: Secondary | ICD-10-CM | POA: Diagnosis not present

## 2016-11-16 LAB — HEMOCCULT GUIAC POC 1CARD (OFFICE): Fecal Occult Blood, POC: NEGATIVE

## 2016-11-16 MED ORDER — ALPRAZOLAM 0.5 MG PO TABS: 0.5000 mg | ORAL_TABLET | Freq: Three times a day (TID) | ORAL | 1 refills | Status: DC | PRN

## 2016-11-16 MED ORDER — CITALOPRAM HYDROBROMIDE 20 MG PO TABS: ORAL_TABLET | ORAL | 4 refills | Status: DC

## 2016-11-16 NOTE — Progress Notes (Signed)
Patient ID: Cassandra Pineda, female   DOB: 05-Jul-1961, 56 y.o.   MRN: [id number] History of Present Illness: Cassandra Pineda is a [age] year old white female, married in for a well woman gyn exam, last pap was 11/04/14 and was normal with negative HPV.    Current Medications, Allergies, Past Medical History, Past Surgical History, Family History and Social History were reviewed in [facility] electronic medical record.     Review of Systems: Patient denies any headaches, hearing loss, fatigue, blurred vision, shortness of breath, chest pain, abdominal pain, problems with bowel movements, urination, or intercourse. No joint pain or mood swings.Pain left breast at times.    Physical Exam:BP 128/66 (BP Location: Left Arm, Patient Position: Sitting, Cuff Size: Normal)   Pulse 73   Ht 5' 2.75" (1.594 m)   Wt 154 lb (69.9 kg)   BMI 27.50 kg/m  General:  Well developed, well nourished, no acute distress Skin:  Warm and dry Neck:  Midline trachea, normal thyroid, good ROM, no lymphadenopathy Lungs; Clear to auscultation bilaterally Breast:  No dominant palpable mass, retraction, or nipple discharge Cardiovascular: Regular rate and rhythm Abdomen:  Soft, non tender, no hepatosplenomegaly Pelvic:  External genitalia is normal in appearance, no lesions.  The vagina is normal in appearance. Urethra has no lesions or masses. The cervix is smooth.  Uterus is felt to be normal size, shape, and contour.  No adnexal masses or tenderness noted.Bladder is non tender, no masses felt. Rectal: Good sphincter tone, no polyps, or hemorrhoids felt.  Hemoccult negative. Extremities/musculoskeletal:  No swelling or varicosities noted, no clubbing or cyanosis Psych:  No mood changes, alert and cooperative,seems happy PHQ 2 score 0.  Impression: 1. Well woman exam with routine gynecological exam   2. Screening for colorectal cancer   3. Anxiety       Plan: Meds ordered this encounter  Medications  . citalopram  (CELEXA) 20 MG tablet    Sig: TAKE 1 TABLET (20 MG TOTAL) BY MOUTH DAILY.    Dispense:  90 tablet    Refill:  4    CYCLE FILL MEDICATION. Authorization is required for next refill.    Order Specific Question:   Supervising Provider    Answer:   Duane LopeEURE, LUTHER H [[id number]]  . ALPRAZolam (XANAX) 0.5 MG tablet    Sig: Take 1 tablet (0.5 mg total) by mouth every 8 (eight) hours as needed. for anxiety    Dispense:  60 tablet    Refill:  1    CYCLE FILL MEDICATION. Authorization is required for next refill.    Order Specific Question:   Supervising Provider    Answer:   Lazaro ArmsEURE, LUTHER H [[id number]]  Physical and pap in 1 year Labs with PCP Mammogram yearly(had yesterday)

## 2017-03-07 ENCOUNTER — Other Ambulatory Visit: Payer: Self-pay | Admitting: Adult Health

## 2017-06-02 ENCOUNTER — Other Ambulatory Visit: Payer: Self-pay | Admitting: *Deleted

## 2017-06-02 MED ORDER — ALBUTEROL SULFATE HFA 108 (90 BASE) MCG/ACT IN AERS: INHALATION_SPRAY | RESPIRATORY_TRACT | 3 refills | Status: DC

## 2017-06-02 NOTE — Telephone Encounter (Signed)
Micah No[facility]Lance with [facility] requested.

## 2017-07-11 ENCOUNTER — Other Ambulatory Visit: Payer: Self-pay | Admitting: Internal Medicine

## 2017-07-27 ENCOUNTER — Other Ambulatory Visit: Payer: Self-pay

## 2017-07-27 ENCOUNTER — Other Ambulatory Visit: Payer: Managed Care, Other (non HMO)

## 2017-07-27 DIAGNOSIS — Z0000 Encounter for general adult medical examination without abnormal findings: Secondary | ICD-10-CM

## 2017-07-27 DIAGNOSIS — E782 Mixed hyperlipidemia: Secondary | ICD-10-CM

## 2017-07-27 DIAGNOSIS — R739 Hyperglycemia, unspecified: Secondary | ICD-10-CM

## 2017-07-27 DIAGNOSIS — I129 Hypertensive chronic kidney disease with stage 1 through stage 4 chronic kidney disease, or unspecified chronic kidney disease: Secondary | ICD-10-CM

## 2017-07-28 LAB — LIPID PANEL
Cholesterol: 150 mg/dL (ref <200)
HDL: 51 mg/dL (ref >50)
LDL Cholesterol (Calc): 79 mg/dL (calc)
Non-HDL Cholesterol (Calc): 99 mg/dL (calc) (ref <130)
Total CHOL/HDL Ratio: 2.9 (calc) (ref <5.0)
Triglycerides: 113 mg/dL (ref <150)

## 2017-07-28 LAB — COMPLETE METABOLIC PANEL WITH GFR
AG Ratio: 1.4 (calc) (ref 1.0–2.5)
ALT: 9 U/L (ref 6–29)
AST: 11 U/L (ref 10–35)
Albumin: 4 g/dL (ref 3.6–5.1)
Alkaline phosphatase (APISO): 79 U/L (ref 33–130)
BUN: 17 mg/dL (ref 7–25)
CO2: 30 mmol/L (ref 20–32)
Calcium: 9.3 mg/dL (ref 8.6–10.4)
Chloride: 103 mmol/L (ref 98–110)
Creat: 0.92 mg/dL (ref 0.50–1.05)
GFR, Est African American: 80 mL/min/{1.73_m2} (ref >=60)
GFR, Est Non African American: 69 mL/min/{1.73_m2} (ref >=60)
Globulin: 2.8 g/dL (calc) (ref 1.9–3.7)
Glucose, Bld: 99 mg/dL (ref 65–99)
Potassium: 4.7 mmol/L (ref 3.5–5.3)
Sodium: 139 mmol/L (ref 135–146)
Total Bilirubin: 0.9 mg/dL (ref 0.2–1.2)
Total Protein: 6.8 g/dL (ref 6.1–8.1)

## 2017-07-28 LAB — CBC WITH DIFFERENTIAL/PLATELET
Basophils Absolute: 68 cells/uL (ref 0–200)
Basophils Relative: 0.9 %
Eosinophils Absolute: 296 cells/uL (ref 15–500)
Eosinophils Relative: 3.9 %
HCT: 38.2 % (ref 35.0–45.0)
Hemoglobin: 12.9 g/dL (ref 11.7–15.5)
Lymphs Abs: 2212 cells/uL (ref 850–3900)
MCH: 29.8 pg (ref 27.0–33.0)
MCHC: 33.8 g/dL (ref 32.0–36.0)
MCV: 88.2 fL (ref 80.0–100.0)
MPV: 9.5 fL (ref 7.5–12.5)
Monocytes Relative: 7.7 %
Neutro Abs: 4438 cells/uL (ref 1500–7800)
Neutrophils Relative %: 58.4 %
Platelets: 262 10*3/uL (ref 140–400)
RBC: 4.33 10*6/uL (ref 3.80–5.10)
RDW: 12 % (ref 11.0–15.0)
Total Lymphocyte: 29.1 %
WBC mixed population: 585 cells/uL (ref 200–950)
WBC: 7.6 10*3/uL (ref 3.8–10.8)

## 2017-07-28 LAB — HEMOGLOBIN A1C
Hgb A1c MFr Bld: 5.9 % of total Hgb — ABNORMAL HIGH (ref <5.7)
Mean Plasma Glucose: 123 (calc)
eAG (mmol/L): 6.8 (calc)

## 2017-08-01 ENCOUNTER — Ambulatory Visit: Payer: Managed Care, Other (non HMO) | Admitting: Internal Medicine

## 2017-08-01 ENCOUNTER — Encounter: Payer: Self-pay | Admitting: Internal Medicine

## 2017-08-01 VITALS — BP 110/70 | HR 53 | Temp 98.0°F | Ht 63.0 in | Wt 150.0 lb

## 2017-08-01 DIAGNOSIS — E782 Mixed hyperlipidemia: Secondary | ICD-10-CM

## 2017-08-01 DIAGNOSIS — I129 Hypertensive chronic kidney disease with stage 1 through stage 4 chronic kidney disease, or unspecified chronic kidney disease: Secondary | ICD-10-CM | POA: Diagnosis not present

## 2017-08-01 DIAGNOSIS — Z0000 Encounter for general adult medical examination without abnormal findings: Secondary | ICD-10-CM

## 2017-08-01 DIAGNOSIS — R739 Hyperglycemia, unspecified: Secondary | ICD-10-CM | POA: Diagnosis not present

## 2017-08-01 DIAGNOSIS — F419 Anxiety disorder, unspecified: Secondary | ICD-10-CM | POA: Diagnosis not present

## 2017-08-01 NOTE — Progress Notes (Signed)
Provider:  Gwenith Spitziffany L. Renato Gailseed, D.O., C.M.D. Location:   [facility]   Place of Service:   clinic  Previous PCP: Kermit Baloeed, Jennfier Abdulla L, DO Patient Care Team: Kermit Baloeed, Barrie Wale L, DO as PCP - General (Geriatric Medicine)  Extended Emergency Contact Information Primary Emergency Contact: Badeaux,Don Address: [address]          [place], [place] Home Phone: [phone] Work Phone: [phone] Relation: Spouse  Code Status: full code Goals of Care: Advanced Directive information Advanced Directives 02/27/2016  Does Patient Have a Medical Advance Directive? No  Type of Advance Directive -  Does patient want to make changes to medical advance directive? -  Copy of Healthcare Power of Attorney in Chart? -  Would patient like information on creating a medical advance directive? No - patient declined information   Chief Complaint  Patient presents with  . Annual Exam    CPE    HPI: Patient is a 57 y.o. female seen today for an annual physical exam/CPE.  She gets her gyn exam with [facility].  This is done in May each year.    Has some increased urges to urinate.  Says she knows this is age-related.  Is in good spirits.  Loves her grandbaby.    Rarely uses albuterol--2-3 times per month maximum.  Only if she's out doing strenous work and doesn't rest makes chest tight (like loading wood)).    Refuses flu shot.  Says her daughter and husband get sick each time.  BP is normal.    Wt down to 150 lbs here.    Past Medical History:  Diagnosis Date  . Anxiety   . Depression   . Hyperlipidemia LDL goal < 100   . Hypertension   . Hypertension, renal disease    HTN related to a kidney disorder  . Nephrotic syndrome   . Renal disorder    Past Surgical History:  Procedure Laterality Date  . RENAL BIOPSY  2013   Annie SableKellie [place]  . TUBAL LIGATION  1993    reports that she quit smoking about 35 years ago. Her smoking use included cigarettes. She has a 45.00  pack-year smoking history. she has never used smokeless tobacco. She reports that she does not drink alcohol or use drugs.  Functional Status Survey:    Family History  Problem Relation Age of Onset  . Heart disease Mother 172  . Parkinson's disease Father   . Asthma Sister   . Hypertension Sister   . Lymphoma Sister 3257  . Hypertension Brother   . Heart disease Brother 45       heart attack  . Hypertension Sister   . Hypertension Brother   . Colon cancer Neg Hx     Health Maintenance  Topic Date Due  . INFLUENZA VACCINE  08/01/2018 (Originally 02/02/2017)  . PAP SMEAR  11/03/2017  . MAMMOGRAM  11/16/2018  . TETANUS/TDAP  06/19/2023  . COLONOSCOPY  08/25/2023  . Hepatitis C Screening  Completed  . HIV Screening  Completed    Allergies  Allergen Reactions  . Ace Inhibitors Cough    Outpatient Encounter Medications as of 08/01/2017  Medication Sig  . acetaminophen (TYLENOL) 325 MG tablet Take 650 mg by mouth every 6 (six) hours as needed.  Marland Kitchen. albuterol (VENTOLIN HFA) 108 (90 Base) MCG/ACT inhaler Inhale 2 puffs into the lungs every 6 hours as needed for wheezing or shortness of breath  . ALPRAZolam (XANAX) 0.5 MG tablet Take  1 tablet (0.5 mg total) by mouth every 8 (eight) hours as needed. for anxiety  . citalopram (CELEXA) 20 MG tablet TAKE 1 TABLET (20 MG TOTAL) BY MOUTH DAILY.  Marland Kitchen losartan (COZAAR) 100 MG tablet TAKE ONE-HALF TABLET BY MOUTH DAILY  . Multiple Vitamin (MULTIVITAMIN) tablet Take 1 tablet by mouth daily.  . simvastatin (ZOCOR) 20 MG tablet TAKE ONE TABLET BY MOUTH EVERY NIGHT AT BEDTIME  . [DISCONTINUED] gabapentin (NEURONTIN) 100 MG capsule TAKE ONE CAPSULE BY MOUTH THREE TIMES A DAY   No facility-administered encounter medications on file as of 08/01/2017.     Review of Systems  Constitutional: Negative for chills, fever and malaise/fatigue.  HENT: Negative for congestion and hearing loss.   Eyes: Negative for blurred vision.       Glasses  Respiratory:  Negative for cough and shortness of breath.   Cardiovascular: Negative for chest pain, palpitations and leg swelling.  Gastrointestinal: Negative for abdominal pain, blood in stool, constipation, heartburn and melena.  Genitourinary: Positive for frequency. Negative for dysuria and urgency.  Musculoskeletal: Negative for falls, joint pain and myalgias.  Skin:       Dry skin from having to wash hands often at work  Neurological: Negative for dizziness, loss of consciousness and weakness.  Endo/Heme/Allergies: Does not bruise/bleed easily.  Psychiatric/Behavioral: Negative for depression and memory loss. The patient is not nervous/anxious.        Anxiety well controlled     Vitals:   08/01/17 0853  BP: 110/70  Pulse: (!) 53  Temp: 98 F (36.7 C)  TempSrc: Oral  SpO2: 96%  Weight: 150 lb (68 kg)  Height: 5\' 3"  (1.6 m)   Body mass index is 26.57 kg/m. Physical Exam  Constitutional: She is oriented to person, place, and time. She appears well-developed and well-nourished. No distress.  HENT:  Head: Normocephalic and atraumatic.  Right Ear: External ear normal.  Left Ear: External ear normal.  Nose: Nose normal.  Mouth/Throat: Oropharynx is clear and moist. No oropharyngeal exudate.  Small swelling under tongue on left more prominent with aphthous ulcers  Eyes: Conjunctivae and EOM are normal. Pupils are equal, round, and reactive to light.  Neck: Normal range of motion. Neck supple.  Cardiovascular: Normal rate, regular rhythm, normal heart sounds and intact distal pulses.  Pulmonary/Chest: Effort normal and breath sounds normal. No respiratory distress. She has no wheezes.  Abdominal: Soft. Bowel sounds are normal. There is no tenderness.  Genitourinary:  Genitourinary Comments: Deferred--done by gyn along with breast exam  Musculoskeletal: Normal range of motion.  Lymphadenopathy:    She has no cervical adenopathy.  Neurological: She is alert and oriented to person, place,  and time. She displays normal reflexes. No cranial nerve deficit. She exhibits normal muscle tone. Coordination normal.  Skin: Skin is warm and dry. Capillary refill takes less than 2 seconds.  Dry skin of hands  Psychiatric: She has a normal mood and affect. Her behavior is normal. Judgment and thought content normal.    Labs reviewed: Basic Metabolic Panel: Recent Labs    07/27/17 0810  NA 139  K 4.7  CL 103  CO2 30  GLUCOSE 99  BUN 17  CREATININE 0.92  CALCIUM 9.3   Liver Function Tests: Recent Labs    07/27/17 0810  AST 11  ALT 9  BILITOT 0.9  PROT 6.8   No results for input(s): LIPASE, AMYLASE in the last 8760 hours. No results for input(s): AMMONIA in the last 8760 hours.  CBC: Recent Labs    07/27/17 0810  WBC 7.6  NEUTROABS 4,438  HGB 12.9  HCT 38.2  MCV 88.2  PLT 262   Cardiac Enzymes: No results for input(s): CKTOTAL, CKMB, CKMBINDEX, TROPONINI in the last 8760 hours. BNP: Invalid input(s): POCBNP Lab Results  Component Value Date   HGBA1C 5.9 (H) 07/27/2017   No results found for: TSH No results found for: VITAMINB12 No results found for: FOLATE No results found for: IRON, TIBC, FERRITIN  Imaging and Procedures Recently: mammo normal in May of last [facility] at [facility]  Assessment/Plan 1. Annual physical exam - performed today, pt doing great - CBC with Differential/Platelet; Future - COMPLETE METABOLIC PANEL WITH GFR; Future - Hemoglobin A1c; Future - Lipid panel; Future -refuses flu shot and other vaccinations at this time including shingrix (had shingles before)  2. Mixed hyperlipidemia -LDL satisfactory at 78 considering no known cad or diabetes - Lipid panel; Future  3. Hyperglycemia -cont healthy diet, walking for exercise - CBC with Differential/Platelet; Future - COMPLETE METABOLIC PANEL WITH GFR; Future - Hemoglobin A1c; Future  4. Anxiety -cont celexa and xanax therapy which has been effective  5.  Hypertension, renal disease, stage 1-4 or unspecified chronic kidney disease - bp at goal with losartan 100mg   - COMPLETE METABOLIC PANEL WITH GFR; Future  Labs/tests ordered:   Orders Placed This Encounter  Procedures  . CBC with Differential/Platelet    Standing Status:   Future    Standing Expiration Date:   08/02/2019  . COMPLETE METABOLIC PANEL WITH GFR    Standing Status:   Future    Standing Expiration Date:   08/02/2019  . Hemoglobin A1c    Standing Status:   Future    Standing Expiration Date:   08/02/2019  . Lipid panel    Standing Status:   Future    Standing Expiration Date:   08/02/2019   F/u in 1 year for CPE, labs before and PRN  Rayshun Kandler L. Mykle Pascua, D.O. Geriatrics [facility] [address], [place] [address] Cell Phone (Mon-Fri 8am-5pm):  [phone] On Call:  [phone] & follow prompts after 5pm & weekends Office Phone:  [phone] Office Fax:  [phone]

## 2017-08-15 ENCOUNTER — Other Ambulatory Visit: Payer: Self-pay | Admitting: Internal Medicine

## 2017-10-10 ENCOUNTER — Other Ambulatory Visit: Payer: Self-pay | Admitting: Internal Medicine

## 2017-11-21 ENCOUNTER — Other Ambulatory Visit: Payer: Managed Care, Other (non HMO) | Admitting: Adult Health

## 2017-12-16 ENCOUNTER — Other Ambulatory Visit: Payer: Managed Care, Other (non HMO) | Admitting: Adult Health

## 2018-01-09 ENCOUNTER — Other Ambulatory Visit: Payer: Self-pay | Admitting: Adult Health

## 2018-02-13 ENCOUNTER — Other Ambulatory Visit: Payer: Self-pay | Admitting: Adult Health

## 2018-02-13 DIAGNOSIS — Z1231 Encounter for screening mammogram for malignant neoplasm of breast: Secondary | ICD-10-CM

## 2018-02-17 ENCOUNTER — Other Ambulatory Visit: Payer: Managed Care, Other (non HMO) | Admitting: Adult Health

## 2018-03-06 ENCOUNTER — Other Ambulatory Visit: Payer: Self-pay | Admitting: Adult Health

## 2018-06-14 ENCOUNTER — Other Ambulatory Visit: Payer: Self-pay | Admitting: Adult Health

## 2018-06-14 MED ORDER — SIMVASTATIN 20 MG PO TABS: 20.0000 mg | ORAL_TABLET | Freq: Every day | ORAL | 2 refills | Status: DC

## 2018-06-14 NOTE — Progress Notes (Signed)
Refill zocor ?

## 2018-07-07 ENCOUNTER — Other Ambulatory Visit: Payer: Managed Care, Other (non HMO) | Admitting: Adult Health

## 2018-08-01 ENCOUNTER — Other Ambulatory Visit: Payer: Managed Care, Other (non HMO)

## 2018-08-01 DIAGNOSIS — R739 Hyperglycemia, unspecified: Secondary | ICD-10-CM

## 2018-08-01 DIAGNOSIS — E782 Mixed hyperlipidemia: Secondary | ICD-10-CM

## 2018-08-01 DIAGNOSIS — Z0000 Encounter for general adult medical examination without abnormal findings: Secondary | ICD-10-CM

## 2018-08-01 DIAGNOSIS — I129 Hypertensive chronic kidney disease with stage 1 through stage 4 chronic kidney disease, or unspecified chronic kidney disease: Secondary | ICD-10-CM

## 2018-08-02 LAB — COMPLETE METABOLIC PANEL WITH GFR
AG Ratio: 1.4 (calc) (ref 1.0–2.5)
ALT: 8 U/L (ref 6–29)
AST: 10 U/L (ref 10–35)
Albumin: 3.9 g/dL (ref 3.6–5.1)
Alkaline phosphatase (APISO): 90 U/L (ref 33–130)
BUN: 19 mg/dL (ref 7–25)
CO2: 29 mmol/L (ref 20–32)
Calcium: 9.4 mg/dL (ref 8.6–10.4)
Chloride: 104 mmol/L (ref 98–110)
Creat: 0.91 mg/dL (ref 0.50–1.05)
GFR, Est African American: 80 mL/min/{1.73_m2} (ref >=60)
GFR, Est Non African American: 69 mL/min/{1.73_m2} (ref >=60)
Globulin: 2.8 g/dL (calc) (ref 1.9–3.7)
Glucose, Bld: 106 mg/dL — ABNORMAL HIGH (ref 65–99)
Potassium: 4.5 mmol/L (ref 3.5–5.3)
Sodium: 139 mmol/L (ref 135–146)
Total Bilirubin: 0.9 mg/dL (ref 0.2–1.2)
Total Protein: 6.7 g/dL (ref 6.1–8.1)

## 2018-08-02 LAB — CBC WITH DIFFERENTIAL/PLATELET
Absolute Monocytes: 570 cells/uL (ref 200–950)
Basophils Absolute: 59 cells/uL (ref 0–200)
Basophils Relative: 0.8 %
Eosinophils Absolute: 370 cells/uL (ref 15–500)
Eosinophils Relative: 5 %
HCT: 38.5 % (ref 35.0–45.0)
Hemoglobin: 13.3 g/dL (ref 11.7–15.5)
Lymphs Abs: 2161 cells/uL (ref 850–3900)
MCH: 30.4 pg (ref 27.0–33.0)
MCHC: 34.5 g/dL (ref 32.0–36.0)
MCV: 88.1 fL (ref 80.0–100.0)
MPV: 9.5 fL (ref 7.5–12.5)
Monocytes Relative: 7.7 %
Neutro Abs: 4240 cells/uL (ref 1500–7800)
Neutrophils Relative %: 57.3 %
Platelets: 289 10*3/uL (ref 140–400)
RBC: 4.37 10*6/uL (ref 3.80–5.10)
RDW: 12.1 % (ref 11.0–15.0)
Total Lymphocyte: 29.2 %
WBC: 7.4 10*3/uL (ref 3.8–10.8)

## 2018-08-02 LAB — HEMOGLOBIN A1C
Hgb A1c MFr Bld: 6 % of total Hgb — ABNORMAL HIGH (ref <5.7)
Mean Plasma Glucose: 126 (calc)
eAG (mmol/L): 7 (calc)

## 2018-08-02 LAB — LIPID PANEL
Cholesterol: 146 mg/dL (ref <200)
HDL: 47 mg/dL — ABNORMAL LOW (ref >50)
LDL Cholesterol (Calc): 77 mg/dL (calc)
Non-HDL Cholesterol (Calc): 99 mg/dL (calc) (ref <130)
Total CHOL/HDL Ratio: 3.1 (calc) (ref <5.0)
Triglycerides: 136 mg/dL (ref <150)

## 2018-08-03 ENCOUNTER — Encounter: Payer: Self-pay | Admitting: Adult Health

## 2018-08-03 ENCOUNTER — Encounter: Payer: Self-pay | Admitting: Internal Medicine

## 2018-08-03 ENCOUNTER — Encounter (HOSPITAL_COMMUNITY): Payer: Self-pay

## 2018-08-03 ENCOUNTER — Other Ambulatory Visit (HOSPITAL_COMMUNITY)
Admission: RE | Admit: 2018-08-03 | Discharge: 2018-08-03 | Disposition: A | Discharge status: Home / Self Care | Payer: Managed Care, Other (non HMO) | Source: Ambulatory Visit | Attending: Adult Health | Admitting: Adult Health

## 2018-08-03 ENCOUNTER — Ambulatory Visit (INDEPENDENT_AMBULATORY_CARE_PROVIDER_SITE_OTHER): Payer: Managed Care, Other (non HMO) | Admitting: Internal Medicine

## 2018-08-03 ENCOUNTER — Ambulatory Visit (INDEPENDENT_AMBULATORY_CARE_PROVIDER_SITE_OTHER): Payer: Managed Care, Other (non HMO) | Admitting: Adult Health

## 2018-08-03 ENCOUNTER — Other Ambulatory Visit (HOSPITAL_COMMUNITY): Payer: Self-pay | Admitting: Adult Health

## 2018-08-03 ENCOUNTER — Ambulatory Visit (HOSPITAL_COMMUNITY)
Admission: RE | Admit: 2018-08-03 | Discharge: 2018-08-03 | Disposition: A | Discharge status: Home / Self Care | Payer: Managed Care, Other (non HMO) | Source: Ambulatory Visit | Attending: Adult Health | Admitting: Adult Health

## 2018-08-03 VITALS — BP 120/70 | HR 52 | Temp 97.9°F | Ht 63.0 in | Wt 153.0 lb

## 2018-08-03 VITALS — BP 125/62 | HR 68 | Ht 63.0 in | Wt 152.0 lb

## 2018-08-03 DIAGNOSIS — F419 Anxiety disorder, unspecified: Secondary | ICD-10-CM

## 2018-08-03 DIAGNOSIS — R590 Localized enlarged lymph nodes: Secondary | ICD-10-CM

## 2018-08-03 DIAGNOSIS — Z1211 Encounter for screening for malignant neoplasm of colon: Secondary | ICD-10-CM

## 2018-08-03 DIAGNOSIS — Z01419 Encounter for gynecological examination (general) (routine) without abnormal findings: Secondary | ICD-10-CM

## 2018-08-03 DIAGNOSIS — Z1212 Encounter for screening for malignant neoplasm of rectum: Secondary | ICD-10-CM

## 2018-08-03 DIAGNOSIS — Z1231 Encounter for screening mammogram for malignant neoplasm of breast: Secondary | ICD-10-CM | POA: Diagnosis present

## 2018-08-03 DIAGNOSIS — I129 Hypertensive chronic kidney disease with stage 1 through stage 4 chronic kidney disease, or unspecified chronic kidney disease: Secondary | ICD-10-CM | POA: Diagnosis not present

## 2018-08-03 DIAGNOSIS — Z0000 Encounter for general adult medical examination without abnormal findings: Secondary | ICD-10-CM | POA: Diagnosis not present

## 2018-08-03 DIAGNOSIS — E782 Mixed hyperlipidemia: Secondary | ICD-10-CM

## 2018-08-03 DIAGNOSIS — R739 Hyperglycemia, unspecified: Secondary | ICD-10-CM | POA: Diagnosis not present

## 2018-08-03 LAB — HEMOCCULT GUIAC POC 1CARD (OFFICE): Fecal Occult Blood, POC: NEGATIVE

## 2018-08-03 NOTE — Progress Notes (Signed)
Provider:  Gwenith Spitziffany L. Renato Gailseed, D.O., C.M.D. Location:   [facility]  Place of Service:    clinic Previous PCP: Kermit Baloeed, Lucy Boardman L, DO Patient Care Team: Kermit Baloeed, Myrick Mcnairy L, DO as PCP - General (Geriatric Medicine)  Extended Emergency Contact Information Primary Emergency Contact: Pennie,Don Address: [address]          [place], [place] Home Phone: [phone] Work Phone: [phone] Relation: Spouse  Goals of Care: Advanced Directive information Advanced Directives 02/27/2016  Does Patient Have a Medical Advance Directive? No  Type of Advance Directive -  Does patient want to make changes to medical advance directive? -  Copy of Healthcare Power of Attorney in Chart? -  Would patient like information on creating a medical advance directive? No - patient declined information   Chief Complaint  Patient presents with  . Annual Exam    CPE    HPI: Patient is a 58 y.o. female seen today for an annual physical exam.  She has no concerns.  We reviewed her labs.    Hyperglycemia: hba1c up to 6 from 5.9  Hyperlipidemia:  At goal  HTN:  bp is great.    May need her albuterol with colds.  Only had to fill once this past year.    No pain.  Has mammo and pap today.  Got flu shot before this.  Had tetanus and swelled her arm for a week.    Past Medical History:  Diagnosis Date  . Anxiety   . Depression   . Hyperlipidemia LDL goal < 100   . Hypertension   . Hypertension, renal disease    HTN related to a kidney disorder  . Nephrotic syndrome   . Renal disorder    Past Surgical History:  Procedure Laterality Date  . RENAL BIOPSY  2013   [facility]nie [place]ellie [place]  . TUBAL LIGATION  1993    reports that she quit smoking about 36 years ago. Her smoking use included cigarettes. She has a 45.00 pack-year smoking history. She has never used smokeless tobacco. She reports that she does not drink alcohol or use drugs.  Functional Status Survey:    Family History  Problem  Relation Age of Onset  . Heart disease Mother 7572  . Parkinson's disease Father   . Asthma Sister   . Hypertension Sister   . Lymphoma Sister 957  . Hypertension Brother   . Heart disease Brother 45       heart attack  . Hypertension Sister   . Hypertension Brother   . Colon cancer Neg Hx     Health Maintenance  Topic Date Due  . PAP SMEAR-Modifier  11/03/2017  . INFLUENZA VACCINE  02/02/2018  . MAMMOGRAM  11/16/2018  . TETANUS/TDAP  06/19/2023  . COLONOSCOPY  08/25/2023  . Hepatitis C Screening  Completed  . HIV Screening  Completed    Allergies  Allergen Reactions  . Ace Inhibitors Cough    Outpatient Encounter Medications as of 08/03/2018  Medication Sig  . acetaminophen (TYLENOL) 325 MG tablet Take 650 mg by mouth every 6 (six) hours as needed.  Marland Kitchen. albuterol (VENTOLIN HFA) 108 (90 Base) MCG/ACT inhaler Inhale 2 puffs into the lungs every 6 hours as needed for wheezing or shortness of breath  . ALPRAZolam (XANAX) 0.5 MG tablet TAKE ONE TABLET BY MOUTH EVERY 8 HOURS AS NEEDED FOR ANXIETY  . citalopram (CELEXA) 20 MG tablet TAKE ONE TABLET BY MOUTH DAILY  . losartan (COZAAR) 100 MG  tablet TAKE 1/2 (ONE-HALF) TABLET BY MOUTH DAILY  . Multiple Vitamin (MULTIVITAMIN) tablet Take 1 tablet by mouth daily.  . simvastatin (ZOCOR) 20 MG tablet Take 1 tablet (20 mg total) by mouth at bedtime.   No facility-administered encounter medications on file as of 08/03/2018.     Review of Systems  Constitutional: Negative for chills and fever.  HENT: Negative for congestion and hearing loss.   Eyes:       Glasses  Respiratory: Negative for cough and shortness of breath.   Cardiovascular: Negative for chest pain, palpitations and leg swelling.  Gastrointestinal: Negative for abdominal pain, blood in stool, constipation, diarrhea, heartburn and melena.  Genitourinary: Negative for dysuria.  Musculoskeletal: Negative for falls and joint pain.       Mild edema end of day in feet  Skin:  Negative for itching and rash.  Neurological: Negative for weakness and headaches.  Endo/Heme/Allergies: Does not bruise/bleed easily.       Notes right sided lymph node under chin that enlarges if she gets a canker sore and then goes back down, no others and no other active symptoms--small at this time; also biting the inside of her mouth on the right all the time at night--going to ask dentist for mouthguard  Psychiatric/Behavioral: Negative for depression and memory loss. The patient is nervous/anxious. The patient does not have insomnia.     Vitals:   08/03/18 0919  BP: 120/70  Pulse: (!) 52  Temp: 97.9 F (36.6 C)  TempSrc: Oral  SpO2: 97%  Weight: 153 lb (69.4 kg)  Height: 5\' 3"  (1.6 m)   Body mass index is 27.1 kg/m. Physical Exam Vitals signs reviewed.  Constitutional:      General: She is not in acute distress.    Appearance: Normal appearance. She is normal weight. She is not toxic-appearing.  HENT:     Head: Normocephalic and atraumatic.     Right Ear: Tympanic membrane, ear canal and external ear normal.     Left Ear: Tympanic membrane, ear canal and external ear normal.     Nose: Nose normal.     Mouth/Throat:     Pharynx: Oropharynx is clear.  Eyes:     Extraocular Movements: Extraocular movements intact.     Conjunctiva/sclera: Conjunctivae normal.     Pupils: Pupils are equal, round, and reactive to light.  Neck:     Musculoskeletal: Normal range of motion.     Comments: Palpable small lymph node submandibular on right Cardiovascular:     Rate and Rhythm: Normal rate and regular rhythm.     Pulses: Normal pulses.     Heart sounds: Normal heart sounds.  Pulmonary:     Effort: Pulmonary effort is normal.     Breath sounds: Normal breath sounds.  Abdominal:     General: Bowel sounds are normal. There is no distension.     Palpations: There is no mass.     Tenderness: There is no abdominal tenderness. There is no guarding or rebound.  Neurological:      Mental Status: She is alert.     Labs reviewed: Basic Metabolic Panel: Recent Labs    08/01/18 0803  NA 139  K 4.5  CL 104  CO2 29  GLUCOSE 106*  BUN 19  CREATININE 0.91  CALCIUM 9.4   Liver Function Tests: Recent Labs    08/01/18 0803  AST 10  ALT 8  BILITOT 0.9  PROT 6.7   No results for input(s): LIPASE,  AMYLASE in the last 8760 hours. No results for input(s): AMMONIA in the last 8760 hours. CBC: Recent Labs    08/01/18 0803  WBC 7.4  NEUTROABS 4,240  HGB 13.3  HCT 38.5  MCV 88.1  PLT 289   Cardiac Enzymes: No results for input(s): CKTOTAL, CKMB, CKMBINDEX, TROPONINI in the last 8760 hours. BNP: Invalid input(s): POCBNP Lab Results  Component Value Date   HGBA1C 6.0 (H) 08/01/2018   No results found for: TSH No results found for: VITAMINB12 No results found for: FOLATE No results found for: IRON, TIBC, FERRITIN   Assessment/Plan 1. Annual physical exam -performed today, is for her female exam this afternoon and mammogram  2. Mixed hyperlipidemia -LDL at goal for her <100, cont current statin  3. Hyperglycemia -has been in prediabetic range, monitor  4. Hypertension, renal disease, stage 1-4 or unspecified chronic kidney disease -Avoid nephrotoxic agents like nsaids, dose adjust renally excreted meds, hydrate. -cont ARB  5. Anxiety -cont SSRI and xanax prn  6. Lymphadenopathy, submandibular -noted single node on right that enlarges and shrinks when she has ulcers; monitor this, but no worrisome symptoms -also biting inside of buccal mucosa--may be reason  Labs/tests ordered:    Orders Placed This Encounter  Procedures  . COMPLETE METABOLIC PANEL WITH GFR    Standing Status:   Future    Standing Expiration Date:   08/04/2019  . CBC with Differential/Platelet    Standing Status:   Future    Standing Expiration Date:   08/04/2019  . Hemoglobin A1c    Standing Status:   Future    Standing Expiration Date:   08/04/2019  . Lipid panel     Standing Status:   Future    Standing Expiration Date:   08/04/2019     Kajal Scalici L. Sheila Ocasio, D.O. Geriatrics [facility] [address], [place] [address] Cell Phone (Mon-Fri 8am-5pm):  [phone] On Call:  [phone] & follow prompts after 5pm & weekends Office Phone:  [phone] Office Fax:  [phone]

## 2018-08-03 NOTE — Progress Notes (Signed)
Patient ID: Cassandra Pineda, female   DOB: 1960/07/12, 58 y.o.   MRN: [id number] History of Present Illness: Cassandra Pineda is a 58 year old white female, married, PM in for a well woman gyn exam and pap. She is still working at [employer] in [place].  PCP is Dr Renato Gails.   Current Medications, Allergies, Past Medical History, Past Surgical History, Family History and Social History were reviewed in [employer] record.     Review of Systems:  Patient denies any headaches, hearing loss, fatigue, blurred vision, shortness of breath, chest pain, abdominal pain, problems with bowel movements, urination, or intercourse(not currently having sex). No joint pain or mood swings.   Physical Exam: BP 125/62 (BP Location: Left Arm, Patient Position: Sitting, Cuff Size: Normal)   Pulse 68   Ht 5\' 3"  (1.6 m)   Wt 152 lb (68.9 kg)   BMI 26.93 kg/m  General:  Well developed, well nourished, no acute distress Skin:  Warm and dry Neck:  Midline trachea, normal thyroid, good ROM, no lymphadenopathy Lungs; Clear to auscultation bilaterally Breast:  No dominant palpable mass, retraction, or nipple discharge Cardiovascular: Regular rate and rhythm Abdomen:  Soft, non tender, no hepatosplenomegaly Pelvic:  External genitalia is normal in appearance, no lesions.  The vagina is pale with loss of moisture and rugae.Marland Kitchen Urethra has no lesions or masses. The cervix is atrophic, pap with HPV performed.  Uterus is felt to be normal size, shape, and contour.  No adnexal masses or tenderness noted.Bladder is non tender, no masses felt. Rectal: Good sphincter tone, no polyps, or hemorrhoids felt.  Hemoccult negative. Extremities/musculoskeletal:  No swelling or varicosities noted, no clubbing or cyanosis Psych:  No mood changes, alert and cooperative,seems happy Fall risk is low.  PHQ 2 score 0.  Examination chaperoned by Malachy Mood LPN.  Impression: 1. Encounter for gynecological examination  with Papanicolaou smear of cervix   2. Screening for colorectal cancer   3. Anxiety       Plan: Had labs with PCP Had mammogram today, was negative for malignancy, get yearly Physical in 1 year Pap in 3 if normal Continue meds, has refills  Colonoscopy per GI

## 2018-08-03 NOTE — Patient Instructions (Addendum)
I recommend you get your bone density when you get your mammogram in the future.   Kegel Exercises Kegel exercises help strengthen the muscles that support the rectum, vagina, small intestine, bladder, and uterus. Doing Kegel exercises can help:  Improve bladder and bowel control.  Improve sexual response.  Reduce problems and discomfort during pregnancy. Kegel exercises involve squeezing your pelvic floor muscles, which are the same muscles you squeeze when you try to stop the flow of urine. The exercises can be done while sitting, standing, or lying down, but it is best to vary your position. Exercises 1. Squeeze your pelvic floor muscles tight. You should feel a tight lift in your rectal area. If you are a female, you should also feel a tightness in your vaginal area. Keep your stomach, buttocks, and legs relaxed. 2. Hold the muscles tight for up to 10 seconds. 3. Relax your muscles. Repeat this exercise 50 times a day or as many times as told by your health care provider. Continue to do this exercise for at least 4-6 weeks or for as long as told by your health care provider. This information is not intended to replace advice given to you by your health care provider. Make sure you discuss any questions you have with your health care provider. Document Released: 06/07/2012 Document Revised: 11/01/2016 Document Reviewed: 05/11/2015 Elsevier Interactive Patient Education  2019 [employer].

## 2018-08-07 LAB — CYTOLOGY - PAP
Diagnosis: NEGATIVE
HPV (WINDOPATH): NOT DETECTED

## 2018-09-12 ENCOUNTER — Other Ambulatory Visit: Payer: Self-pay | Admitting: Adult Health

## 2018-10-23 ENCOUNTER — Other Ambulatory Visit: Payer: Self-pay | Admitting: Internal Medicine

## 2018-10-29 ENCOUNTER — Encounter: Payer: Self-pay | Admitting: Internal Medicine

## 2018-12-14 ENCOUNTER — Other Ambulatory Visit: Payer: Self-pay | Admitting: Internal Medicine

## 2019-02-26 ENCOUNTER — Other Ambulatory Visit: Payer: Self-pay | Admitting: Internal Medicine

## 2019-05-14 ENCOUNTER — Other Ambulatory Visit: Payer: Self-pay | Admitting: Adult Health

## 2019-06-20 ENCOUNTER — Telehealth: Payer: Self-pay | Admitting: Internal Medicine

## 2019-06-20 DIAGNOSIS — K121 Other forms of stomatitis: Secondary | ICD-10-CM

## 2019-06-20 MED ORDER — COLCHICINE 0.6 MG PO TABS: 1.2000 mg | ORAL_TABLET | Freq: Every day | ORAL | 3 refills | Status: DC

## 2019-06-20 NOTE — Telephone Encounter (Signed)
"  Hey Dr. Mariea Clonts, I have a question about my mom who you see Cassandra Pineda DOB [birth year]). She has mentioned before to you about her mouth ulcers. She has been to ENT, her dentist, oral surgeon and so far no one has been really able to help her with these sores. She now has 3 on her tongue. She may have 3-5 days a month that she does NOT have any at all. 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 For this disease its been recommended to use Colchicine 1-2 mg/day for prevention of oral ulcers or apremilast titrated up to 30 mg BID for maintenance dose. Low dose prednisone was also recommended however I would be concerned about side effects with chronic use.  I wanted to see what you thought about these options for her. She is miserable. She has to eat a bland diet because she always has the sores. She keeps orajel on her at all times to help dull the pain."--Megan Millikan  Please call [name] and advise that I'm ok with [name] trying to colchicine for her mom, but we'll have to keep an eye on her renal function and lookout for diarrhea as side effects.  Also, it can cause myopathy when combined with her cholesterol medication.  I ordered it as 0.6mg  tabs taking two daily.  I had only heard a little bit about the ulcers myself from her and did not realize she was struggling so much with it.  I also sent an email and text of the mychart info to her mom so she may be able to get her mychart account straightened out.

## 2019-06-22 NOTE — Telephone Encounter (Signed)
Spoke with daughter and advised results.  

## 2019-07-09 ENCOUNTER — Other Ambulatory Visit: Payer: Self-pay

## 2019-07-09 ENCOUNTER — Telehealth (INDEPENDENT_AMBULATORY_CARE_PROVIDER_SITE_OTHER): Payer: Managed Care, Other (non HMO) | Admitting: Family

## 2019-07-09 ENCOUNTER — Encounter: Payer: Self-pay | Admitting: Family

## 2019-07-09 DIAGNOSIS — U071 COVID-19: Secondary | ICD-10-CM

## 2019-07-09 NOTE — Progress Notes (Signed)
Cassandra Curry, DO  Patient Care Team: Cassandra Curry, DO as PCP - General (Geriatric Medicine)  Extended Emergency Contact Information Primary Emergency Contact: Cassandra,Pineda Address: [place], [place] Phone: [phone] Work Phone: [phone] Relation: Spouse  Code Status:  Full code  Goals of care: Advanced Directive information Advanced Directives 07/09/2019  Does Patient Have a Medical Advance Directive? No  Type of Advance Directive -  Does patient want to make changes to medical advance directive? -  Copy of [place] in Chart? -  Would patient like information on creating a medical advance directive? No - Patient declined     Chief Complaint  Patient presents with  . Acute Visit    Patient tested positive with COVID and needs work note in order to go back to work     HPI:  Pt is a 59 y.o. female seen today for an acute visit for work clearance letter.she states husband tested positive for COVID-19 on 07/04/2019.On 07/07/2019 she developed headache and runny nose so she went to [facility] and got tested for COVID-19.Her results was positive.she states symptoms have resolved.she has taken Zinc,vit D and Vit C supplements.she denies any fever,chills,coigh,fatigue or shortness of breath.she states husband also recovering well. She was advised to be out of work for 10 days and returned when fever free.Her last quarantine day is 07/15/2019.she request letter send via Mychart so that she can emailed to work.    Past Medical History:  Diagnosis Date  . Anxiety   . Depression   . Hyperlipidemia LDL goal < 100   . Hypertension   . Hypertension, renal disease    HTN related to a kidney disorder  . Nephrotic syndrome   . Renal disorder    Past Surgical History:  Procedure Laterality Date  . RENAL BIOPSY  2013   [place]  . TUBAL LIGATION  1993    Allergies  Allergen Reactions  . Ace  Inhibitors Cough    Outpatient Encounter Medications as of 07/09/2019  Medication Sig  . acetaminophen (TYLENOL) 325 MG tablet Take 650 mg by mouth every 6 (six) hours as needed.  Marland Kitchen albuterol (PROAIR HFA) 108 (90 Base) MCG/ACT inhaler INHALE 2 PUFFS INTO THE LUNGS EVERY SIX HOURS AS NEEDED FOR WHEEZING OR SHORTNESS OF BREATH  . ALPRAZolam (XANAX) 0.5 MG tablet TAKE ONE TABLET BY MOUTH EVERY 8 HOURS AS NEEDED FOR ANXIETY  . citalopram (CELEXA) 20 MG tablet TAKE ONE TABLET BY MOUTH DAILY  . colchicine 0.6 MG tablet Take 2 tablets (1.2 mg total) by mouth daily.  Marland Kitchen losartan (COZAAR) 100 MG tablet TAKE ONE-HALF TABLET BY MOUTH DAILY  . Multiple Vitamin (MULTIVITAMIN) tablet Take 1 tablet by mouth daily.  . simvastatin (ZOCOR) 20 MG tablet Take 1 tablet (20 mg total) by mouth at bedtime.   No facility-administered encounter medications on file as of 07/09/2019.    Review of Systems  Constitutional: Negative for appetite change, chills, fatigue and fever.  HENT: Negative for congestion, rhinorrhea, sinus pressure, sinus pain, sneezing, sore throat and trouble swallowing.   Eyes: Negative for discharge, redness and itching.  Respiratory: Negative for cough, chest tightness, shortness of breath and wheezing.   Cardiovascular: Negative for chest pain, palpitations and leg swelling.  Gastrointestinal: Negative for abdominal distention, abdominal pain, diarrhea, nausea and vomiting.  Musculoskeletal: Negative for gait problem and myalgias.  Skin: Negative for  color change and pallor.  Neurological: Negative for dizziness, weakness, light-headedness and headaches.  Psychiatric/Behavioral: Negative for agitation and confusion.    Immunization History  Administered Date(s) Administered  . Influenza-Unspecified 05/05/2018  . Tdap 06/18/2013   Pertinent  Health Maintenance Due  Topic Date Due  . INFLUENZA VACCINE  02/03/2019  . MAMMOGRAM  08/03/2020  . PAP SMEAR-Modifier  08/03/2021  . COLONOSCOPY   08/25/2023   Fall Risk  08/03/2018 08/01/2017 04/08/2016 02/27/2016 10/06/2015  Falls in the past year? 0 No No No No  Number falls in past yr: 0 - - - -  Injury with Fall? 0 - - - -   There were no vitals filed for this visit. There is no height or weight on file to calculate BMI. Physical Exam Constitutional:      General: She is not in acute distress.    Appearance: She is not ill-appearing.  HENT:     Head: Normocephalic.     Nose: No congestion or rhinorrhea.  Eyes:     General: No scleral icterus.       Right eye: No discharge.        Left eye: No discharge.     Conjunctiva/sclera: Conjunctivae normal.  Pulmonary:     Effort: Pulmonary effort is normal.  Musculoskeletal:        General: Normal range of motion.  Skin:    Coloration: Skin is not pale.     Findings: No erythema.  Neurological:     Mental Status: She is alert and oriented to person, place, and time.     Motor: No weakness.     Gait: Gait normal.  Psychiatric:        Mood and Affect: Mood normal.        Behavior: Behavior normal.        Thought Content: Thought content normal.        Judgment: Judgment normal.    Labs reviewed: Recent Labs    08/01/18 0803  NA 139  K 4.5  CL 104  CO2 29  GLUCOSE 106*  BUN 19  CREATININE 0.91  CALCIUM 9.4   Recent Labs    08/01/18 0803  AST 10  ALT 8  BILITOT 0.9  PROT 6.7   Recent Labs    08/01/18 0803  WBC 7.4  NEUTROABS 4,240  HGB 13.3  HCT 38.5  MCV 88.1  PLT 289   No results found for: TSH Lab Results  Component Value Date   HGBA1C 6.0 (H) 08/01/2018   Lab Results  Component Value Date   CHOL 146 08/01/2018   HDL 47 (L) 08/01/2018   LDLCALC 77 08/01/2018   TRIG 136 08/01/2018   CHOLHDL 3.1 08/01/2018    Significant Diagnostic Results in last 30 days:  No results found.  Assessment/Plan  COVID-19 virus infection Afebrile.Reports positive test for COVID-19 07/07/2019 after husband tested positive.Her slight headache and runny nose  has resolved.she remains asymptomatic. - continue on Vit C,Zinc and Vitamin D supplements. - Increase fluid intake  - continue with rest  - continue to wear facial mask,social distance and hand hygiene  per CDC guidelines. - May return to work on 07/15/2019 if symptoms and fever  Free for 3 days. Letter send via mychart message per patient's request.  Family/ staff Communication: Reviewed plan of care with patient.  Labs/tests ordered: None   Spent 15 minutes of face to face on video with patient   Next Visit: As needed

## 2019-07-10 NOTE — Telephone Encounter (Signed)
A letter was created this morning in communications and sent to patient's mychart. Provider also sent a message to patient via mychart.  No further actions required.

## 2019-07-10 NOTE — Telephone Encounter (Signed)
Message forwarded to Dinah's in office medical assistant to further follow-up and address. I am offsite today

## 2019-07-10 NOTE — Telephone Encounter (Signed)
Chare,I send as a Clinical cytogeneticist message per patient's request.I've written another letter as a communication. Thanks  [employer]

## 2019-07-17 ENCOUNTER — Other Ambulatory Visit: Payer: Self-pay

## 2019-08-06 ENCOUNTER — Other Ambulatory Visit: Payer: Self-pay | Admitting: Internal Medicine

## 2019-08-06 DIAGNOSIS — R739 Hyperglycemia, unspecified: Secondary | ICD-10-CM

## 2019-08-06 DIAGNOSIS — I129 Hypertensive chronic kidney disease with stage 1 through stage 4 chronic kidney disease, or unspecified chronic kidney disease: Secondary | ICD-10-CM

## 2019-08-06 DIAGNOSIS — E782 Mixed hyperlipidemia: Secondary | ICD-10-CM

## 2019-08-07 ENCOUNTER — Other Ambulatory Visit: Payer: Managed Care, Other (non HMO)

## 2019-08-07 ENCOUNTER — Other Ambulatory Visit: Payer: Self-pay

## 2019-08-07 DIAGNOSIS — R739 Hyperglycemia, unspecified: Secondary | ICD-10-CM

## 2019-08-07 DIAGNOSIS — I129 Hypertensive chronic kidney disease with stage 1 through stage 4 chronic kidney disease, or unspecified chronic kidney disease: Secondary | ICD-10-CM

## 2019-08-07 DIAGNOSIS — E782 Mixed hyperlipidemia: Secondary | ICD-10-CM

## 2019-08-08 LAB — COMPLETE METABOLIC PANEL WITH GFR
AG Ratio: 1.6 (calc) (ref 1.0–2.5)
ALT: 18 U/L (ref 6–29)
AST: 14 U/L (ref 10–35)
Albumin: 4.3 g/dL (ref 3.6–5.1)
Alkaline phosphatase (APISO): 121 U/L (ref 37–153)
BUN: 18 mg/dL (ref 7–25)
CO2: 29 mmol/L (ref 20–32)
Calcium: 9 mg/dL (ref 8.6–10.4)
Chloride: 104 mmol/L (ref 98–110)
Creat: 0.89 mg/dL (ref 0.50–0.99)
GFR, Est African American: 82 mL/min/{1.73_m2} (ref >=60)
GFR, Est Non African American: 70 mL/min/{1.73_m2} (ref >=60)
Globulin: 2.7 g/dL (calc) (ref 1.9–3.7)
Glucose, Bld: 97 mg/dL (ref 65–99)
Potassium: 4.1 mmol/L (ref 3.5–5.3)
Sodium: 140 mmol/L (ref 135–146)
Total Bilirubin: 0.5 mg/dL (ref 0.2–1.2)
Total Protein: 7 g/dL (ref 6.1–8.1)

## 2019-08-08 LAB — CBC WITH DIFFERENTIAL/PLATELET
Absolute Monocytes: 657 cells/uL (ref 200–950)
Basophils Absolute: 74 cells/uL (ref 0–200)
Basophils Relative: 1.1 %
Eosinophils Absolute: 496 cells/uL (ref 15–500)
Eosinophils Relative: 7.4 %
HCT: 38.6 % (ref 35.0–45.0)
Hemoglobin: 13.3 g/dL (ref 11.7–15.5)
Lymphs Abs: 2204 cells/uL (ref 850–3900)
MCH: 30.6 pg (ref 27.0–33.0)
MCHC: 34.5 g/dL (ref 32.0–36.0)
MCV: 88.7 fL (ref 80.0–100.0)
MPV: 9.9 fL (ref 7.5–12.5)
Monocytes Relative: 9.8 %
Neutro Abs: 3270 cells/uL (ref 1500–7800)
Neutrophils Relative %: 48.8 %
Platelets: 253 10*3/uL (ref 140–400)
RBC: 4.35 10*6/uL (ref 3.80–5.10)
RDW: 12 % (ref 11.0–15.0)
Total Lymphocyte: 32.9 %
WBC: 6.7 10*3/uL (ref 3.8–10.8)

## 2019-08-08 LAB — LIPID PANEL
Cholesterol: 197 mg/dL (ref <200)
HDL: 49 mg/dL — ABNORMAL LOW (ref >=50)
LDL Cholesterol (Calc): 125 mg/dL (calc) — ABNORMAL HIGH
Non-HDL Cholesterol (Calc): 148 mg/dL (calc) — ABNORMAL HIGH (ref <130)
Total CHOL/HDL Ratio: 4 (calc) (ref <5.0)
Triglycerides: 118 mg/dL (ref <150)

## 2019-08-08 LAB — HEMOGLOBIN A1C
Hgb A1c MFr Bld: 6 % of total Hgb — ABNORMAL HIGH (ref <5.7)
Mean Plasma Glucose: 126 (calc)
eAG (mmol/L): 7 (calc)

## 2019-08-08 NOTE — Progress Notes (Signed)
Blood counts are normal. Bad cholesterol has trended up while on zocor 20mg --we will need to make a change to this at her visit.   Electrolytes and kidneys are normal.   Sugar average is 6 which is the same as last year (in prediabetic range)

## 2019-08-09 ENCOUNTER — Other Ambulatory Visit: Payer: Self-pay

## 2019-08-09 ENCOUNTER — Ambulatory Visit (INDEPENDENT_AMBULATORY_CARE_PROVIDER_SITE_OTHER): Payer: Managed Care, Other (non HMO) | Admitting: Internal Medicine

## 2019-08-09 ENCOUNTER — Encounter: Payer: Self-pay | Admitting: Internal Medicine

## 2019-08-09 ENCOUNTER — Ambulatory Visit (HOSPITAL_COMMUNITY)
Admission: RE | Admit: 2019-08-09 | Discharge: 2019-08-09 | Disposition: A | Discharge status: Home / Self Care | Payer: Managed Care, Other (non HMO) | Source: Ambulatory Visit | Attending: Adult Health | Admitting: Adult Health

## 2019-08-09 VITALS — BP 128/74 | HR 78 | Temp 97.5°F | Ht 63.0 in | Wt 150.0 lb

## 2019-08-09 DIAGNOSIS — R739 Hyperglycemia, unspecified: Secondary | ICD-10-CM | POA: Diagnosis not present

## 2019-08-09 DIAGNOSIS — Z0000 Encounter for general adult medical examination without abnormal findings: Secondary | ICD-10-CM | POA: Diagnosis not present

## 2019-08-09 DIAGNOSIS — E782 Mixed hyperlipidemia: Secondary | ICD-10-CM | POA: Diagnosis not present

## 2019-08-09 DIAGNOSIS — K121 Other forms of stomatitis: Secondary | ICD-10-CM

## 2019-08-09 DIAGNOSIS — Z1231 Encounter for screening mammogram for malignant neoplasm of breast: Secondary | ICD-10-CM | POA: Insufficient documentation

## 2019-08-09 DIAGNOSIS — I129 Hypertensive chronic kidney disease with stage 1 through stage 4 chronic kidney disease, or unspecified chronic kidney disease: Secondary | ICD-10-CM | POA: Diagnosis not present

## 2019-08-09 DIAGNOSIS — S81812A Laceration without foreign body, left lower leg, initial encounter: Secondary | ICD-10-CM

## 2019-08-09 NOTE — Patient Instructions (Addendum)
I recommend you cut down on high fat foods like honey buns that have caused your cholesterol to trend up.  Restart your zocor.  If the ulcers get worse, we'll switch you to lipitor or crestor.  I also recommend 150 mins of exercise per week--a combination of aerobic like walking and some other weightbearing like tai chi, weights or yoga for strengthening.  This will keep you strong as you age and support your bones.   Exercising to Stay Healthy To become healthy and stay healthy, it is recommended that you do moderate-intensity and vigorous-intensity exercise. You can tell that you are exercising at a moderate intensity if your heart starts beating faster and you start breathing faster but can still hold a conversation. You can tell that you are exercising at a vigorous intensity if you are breathing much harder and faster and cannot hold a conversation while exercising. Exercising regularly is important. It has many health benefits, such as:  Improving overall fitness, flexibility, and endurance.  Increasing bone density.  Helping with weight control.  Decreasing body fat.  Increasing muscle strength.  Reducing stress and tension.  Improving overall health. How often should I exercise? Choose an activity that you enjoy, and set realistic goals. Your health care provider can help you make an activity plan that works for you. Exercise regularly as told by your health care provider. This may include:  Doing strength training two times a week, such as: ? Lifting weights. ? Using resistance bands. ? Push-ups. ? Sit-ups. ? Yoga.  Doing a certain intensity of exercise for a given amount of time. Choose from these options: ? A total of 150 minutes of moderate-intensity exercise every week. ? A total of 75 minutes of vigorous-intensity exercise every week. ? A mix of moderate-intensity and vigorous-intensity exercise every week. Children, pregnant women, people who have not exercised  regularly, people who are overweight, and older adults may need to talk with a health care provider about what activities are safe to do. If you have a medical condition, be sure to talk with your health care provider before you start a new exercise program. What are some exercise ideas? Moderate-intensity exercise ideas include:  Walking 1 mile (1.6 km) in about 15 minutes.  Biking.  Hiking.  Golfing.  Dancing.  Water aerobics. Vigorous-intensity exercise ideas include:  Walking 4.5 miles (7.2 km) or more in about 1 hour.  Jogging or running 5 miles (8 km) in about 1 hour.  Biking 10 miles (16.1 km) or more in about 1 hour.  Lap swimming.  Roller-skating or in-line skating.  Cross-country skiing.  Vigorous competitive sports, such as football, basketball, and soccer.  Jumping rope.  Aerobic dancing. What are some everyday activities that can help me to get exercise?  [place] work, such as: ? Pushing a Conservation officer, nature. ? Raking and bagging leaves.  Washing your car.  Pushing a stroller.  Shoveling snow.  Gardening.  Washing windows or floors. How can I be more active in my day-to-day activities?  Use stairs instead of an elevator.  Take a walk during your lunch break.  If you drive, park your car farther away from your work or school.  If you take public transportation, get off one stop early and walk the rest of the way.  Stand up or walk around during all of your indoor phone calls.  Get up, stretch, and walk around every 30 minutes throughout the day.  Enjoy exercise with a friend. Support to continue  exercising will help you keep a regular routine of activity. What guidelines can I follow while exercising?  Before you start a new exercise program, talk with your health care provider.  Do not exercise so much that you hurt yourself, feel dizzy, or get very short of breath.  Wear comfortable clothes and wear shoes with good support.  Drink plenty of  water while you exercise to prevent dehydration or heat stroke.  Work out until your breathing and your heartbeat get faster. Where to find more information  U.S. Department of Health and Human Services: [employer]at  Centers for Disease Control and Prevention (CDC): http://www.wolf.info/ Summary  Exercising regularly is important. It will improve your overall fitness, flexibility, and endurance.  Regular exercise also will improve your overall health. It can help you control your weight, reduce stress, and improve your bone density.  Do not exercise so much that you hurt yourself, feel dizzy, or get very short of breath.  Before you start a new exercise program, talk with your health care provider. This information is not intended to replace advice given to you by your health care provider. Make sure you discuss any questions you have with your health care provider. Document Revised: 06/03/2017 Document Reviewed: 05/12/2017 Elsevier Patient Education  [place].  High Cholesterol  High cholesterol is a condition in which the blood has high levels of a white, waxy, fat-like substance (cholesterol). The human body needs small amounts of cholesterol. The liver makes all the cholesterol that the body needs. Extra (excess) cholesterol comes from the food that we eat. Cholesterol is carried from the liver by the blood through the blood vessels. If you have high cholesterol, deposits (plaques) may build up on the walls of your blood vessels (arteries). Plaques make the arteries narrower and stiffer. Cholesterol plaques increase your risk for heart attack and stroke. Work with your health care provider to keep your cholesterol levels in a healthy range. What increases the risk? This condition is more likely to develop in people who:  Eat foods that are high in animal fat (saturated fat) or cholesterol.  Are overweight.  Are not getting enough exercise.  Have a family history of high  cholesterol. What are the signs or symptoms? There are no symptoms of this condition. How is this diagnosed? This condition may be diagnosed from the results of a blood test.  If you are older than age 59, your health care provider may check your cholesterol every 4-6 years.  You may be checked more often if you already have high cholesterol or other risk factors for heart disease. The blood test for cholesterol measures:  "Bad" cholesterol (LDL cholesterol). This is the main type of cholesterol that causes heart disease. The desired level for LDL is less than 100.  "Good" cholesterol (HDL cholesterol). This type helps to protect against heart disease by cleaning the arteries and carrying the LDL away. The desired level for HDL is 60 or higher.  Triglycerides. These are fats that the body can store or burn for energy. The desired number for triglycerides is lower than 150.  Total cholesterol. This is a measure of the total amount of cholesterol in your blood, including LDL cholesterol, HDL cholesterol, and triglycerides. A healthy number is less than 200. How is this treated? This condition is treated with diet changes, lifestyle changes, and medicines. Diet changes  This may include eating more whole grains, fruits, vegetables, nuts, and fish.  This may also include cutting back on  red meat and foods that have a lot of added sugar. Lifestyle changes  Changes may include getting at least 40 minutes of aerobic exercise 3 times a week. Aerobic exercises include walking, biking, and swimming. Aerobic exercise along with a healthy diet can help you maintain a healthy weight.  Changes may also include quitting smoking. Medicines  Medicines are usually given if diet and lifestyle changes have failed to reduce your cholesterol to healthy levels.  Your health care provider may prescribe a statin medicine. Statin medicines have been shown to reduce cholesterol, which can reduce the risk of  heart disease. Follow these instructions at home: Eating and drinking If told by your health care provider:  Eat chicken (without skin), fish, veal, shellfish, ground [place] breast, and round or loin cuts of red meat.  Do not eat fried foods or fatty meats, such as hot dogs and salami.  Eat plenty of fruits, such as apples.  Eat plenty of vegetables, such as broccoli, potatoes, and carrots.  Eat beans, peas, and lentils.  Eat grains such as barley, rice, couscous, and bulgur wheat.  Eat pasta without cream sauces.  Use skim or nonfat milk, and eat low-fat or nonfat yogurt and cheeses.  Do not eat or drink whole milk, cream, ice cream, egg yolks, or hard cheeses.  Do not eat stick margarine or tub margarines that contain trans fats (also called partially hydrogenated oils).  Do not eat saturated tropical oils, such as coconut oil and palm oil.  Do not eat cakes, cookies, crackers, or other baked goods that contain trans fats.  General instructions  Exercise as directed by your health care provider. Increase your activity level with activities such as gardening, walking, and taking the stairs.  Take over-the-counter and prescription medicines only as told by your health care provider.  Do not use any products that contain nicotine or tobacco, such as cigarettes and e-cigarettes. If you need help quitting, ask your health care provider.  Keep all follow-up visits as told by your health care provider. This is important. Contact a health care provider if:  You are struggling to maintain a healthy diet or weight.  You need help to start on an exercise program.  You need help to stop smoking. Get help right away if:  You have chest pain.  You have trouble breathing. This information is not intended to replace advice given to you by your health care provider. Make sure you discuss any questions you have with your health care provider. Document Revised: 06/24/2017 Document  Reviewed: 12/20/2015 Elsevier Patient Education  [place].

## 2019-08-09 NOTE — Progress Notes (Signed)
Provider:  Gwenith Spitz. Renato Gails, D.O., C.M.D. Location:   [facility]   Place of Service:    clinic  Previous PCP: Kermit Balo, DO Patient Care Team: Kermit Balo, DO as PCP - General (Geriatric Medicine)  Extended Emergency Contact Information Primary Emergency Contact: Fabiano,Don Address: [address]          [place], [place] Home Phone: [phone] Work Phone: [phone] Relation: Spouse  Goals of Care: Advanced Directive information Advanced Directives 08/09/2019  Does Patient Have a Medical Advance Directive? No  Type of Advance Directive -  Does patient want to make changes to medical advance directive? -  Copy of Healthcare Power of Attorney in Chart? -  Would patient like information on creating a medical advance directive? No - Patient declined      Chief Complaint  Patient presents with  . Annual Exam    Yearly physical with lab result     HPI: Patient is a 59 y.o. female seen today for an annual physical exam.  She had stopped her cholesterol medication.  She is taking the colchicine.  She had a little loose stool first thing in the morning, but nothing not manageable.  She's had half of the mouth ulcers she did have.  She bit her gum and has one area now but not an ulcer.    She has not yet done her living will and HCPOA.  Her daughter, [name] has power of attorney for her.  No concerns.    Past Medical History:  Diagnosis Date  . Anxiety   . Depression   . Hyperlipidemia LDL goal < 100   . Hypertension   . Hypertension, renal disease    HTN related to a kidney disorder  . Nephrotic syndrome   . Renal disorder    Past Surgical History:  Procedure Laterality Date  . RENAL BIOPSY  2013   [name]  . TUBAL LIGATION  1993    reports that she quit smoking about 37 years ago. Her smoking use included cigarettes. She has a 45.00 pack-year smoking history. She has never used smokeless tobacco. She reports that she does not  drink alcohol or use drugs.  Functional Status Survey:    Family History  Problem Relation Age of Onset  . Heart disease Mother 31  . Parkinson's disease Father   . Asthma Sister   . Hypertension Sister   . Lymphoma Sister 75  . Hypertension Brother   . Heart disease Brother 45       heart attack  . Hypertension Sister   . Hypertension Brother   . Colon cancer Neg Hx     Health Maintenance  Topic Date Due  . MAMMOGRAM  08/03/2020  . PAP SMEAR-Modifier  08/03/2021  . TETANUS/TDAP  06/19/2023  . COLONOSCOPY  08/25/2023  . Hepatitis C Screening  Completed  . HIV Screening  Completed  . INFLUENZA VACCINE  Discontinued    Allergies  Allergen Reactions  . Ace Inhibitors Cough    Outpatient Encounter Medications as of 08/09/2019  Medication Sig  . acetaminophen (TYLENOL) 325 MG tablet Take 650 mg by mouth every 6 (six) hours as needed.  Marland Kitchen albuterol (PROAIR HFA) 108 (90 Base) MCG/ACT inhaler INHALE 2 PUFFS INTO THE LUNGS EVERY SIX HOURS AS NEEDED FOR WHEEZING OR SHORTNESS OF BREATH  . ALPRAZolam (XANAX) 0.5 MG tablet TAKE ONE TABLET BY MOUTH EVERY 8 HOURS AS NEEDED FOR ANXIETY  . citalopram (CELEXA) 20 MG  tablet TAKE ONE TABLET BY MOUTH DAILY  . colchicine 0.6 MG tablet Take 2 tablets (1.2 mg total) by mouth daily.  Marland Kitchen losartan (COZAAR) 100 MG tablet TAKE ONE-HALF TABLET BY MOUTH DAILY  . Multiple Vitamin (MULTIVITAMIN) tablet Take 1 tablet by mouth daily.  . simvastatin (ZOCOR) 20 MG tablet Take 1 tablet (20 mg total) by mouth at bedtime.   No facility-administered encounter medications on file as of 08/09/2019.    Review of Systems  Constitutional: Negative for chills, fever and malaise/fatigue.  HENT: Negative for congestion, hearing loss and sore throat.        Mouth ulcers have decreased by 1/2  Eyes: Negative for blurred vision.       Glasses  Respiratory: Negative for cough and shortness of breath.   Cardiovascular: Negative for chest pain, palpitations and leg  swelling.  Gastrointestinal: Negative for abdominal pain, constipation, diarrhea, nausea and vomiting.       Early am loose bms  Genitourinary: Negative for dysuria.  Musculoskeletal: Negative for joint pain and myalgias.  Skin: Negative for itching and rash.  Neurological: Negative for dizziness, tingling, sensory change, loss of consciousness and weakness.  Endo/Heme/Allergies: Does not bruise/bleed easily.  Psychiatric/Behavioral: Negative for depression and memory loss. The patient is not nervous/anxious and does not have insomnia.     Vitals:   08/09/19 0858  BP: 128/74  Pulse: 78  Temp: (!) 97.5 F (36.4 C)  TempSrc: Temporal  SpO2: 97%  Weight: 150 lb (68 kg)  Height: 5\' 3"  (1.6 m)   Body mass index is 26.57 kg/m. Physical Exam Nursing note reviewed.  Constitutional:      General: She is not in acute distress.    Appearance: Normal appearance. She is normal weight. She is not ill-appearing or toxic-appearing.  HENT:     Head: Normocephalic and atraumatic.     Right Ear: External ear normal.     Left Ear: External ear normal.     Nose: Nose normal.     Mouth/Throat:     Mouth: Mucous membranes are moist.     Pharynx: Oropharynx is clear. No oropharyngeal exudate.     Comments: Single ulcer inside right cheek where she bit her buccal mucosa Eyes:     Extraocular Movements: Extraocular movements intact.     Conjunctiva/sclera: Conjunctivae normal.     Pupils: Pupils are equal, round, and reactive to light.     Comments: glasses  Cardiovascular:     Rate and Rhythm: Normal rate and regular rhythm.     Pulses: Normal pulses.     Heart sounds: Normal heart sounds.  Pulmonary:     Effort: Pulmonary effort is normal.     Breath sounds: Normal breath sounds. No wheezing, rhonchi or rales.  Abdominal:     General: Bowel sounds are normal.     Palpations: Abdomen is soft.  Musculoskeletal:        General: Normal range of motion.     Right lower leg: No edema.      Left lower leg: No edema.  Skin:    General: Skin is warm and dry.     Capillary Refill: Capillary refill takes less than 2 seconds.     Comments: Very dry skin on hands; small injury to tip of index finger of left hand with bandaid; 2 inch laceration on left anterior shin, centrally bleeding when dressing removed  Neurological:     General: No focal deficit present.     Mental  Status: She is alert and oriented to person, place, and time. Mental status is at baseline.     Cranial Nerves: No cranial nerve deficit.     Sensory: No sensory deficit.     Motor: No weakness.     Coordination: Coordination normal.     Gait: Gait normal.     Deep Tendon Reflexes: Reflexes normal.  Psychiatric:        Mood and Affect: Mood normal.        Behavior: Behavior normal.        Thought Content: Thought content normal.        Judgment: Judgment normal.     Labs reviewed: Basic Metabolic Panel: Recent Labs    08/07/19 0810  NA 140  K 4.1  CL 104  CO2 29  GLUCOSE 97  BUN 18  CREATININE 0.89  CALCIUM 9.0   Liver Function Tests: Recent Labs    08/07/19 0810  AST 14  ALT 18  BILITOT 0.5  PROT 7.0   No results for input(s): LIPASE, AMYLASE in the last 8760 hours. No results for input(s): AMMONIA in the last 8760 hours. CBC: Recent Labs    08/07/19 0810  WBC 6.7  NEUTROABS 3,270  HGB 13.3  HCT 38.6  MCV 88.7  PLT 253   Cardiac Enzymes: No results for input(s): CKTOTAL, CKMB, CKMBINDEX, TROPONINI in the last 8760 hours. BNP: Invalid input(s): POCBNP Lab Results  Component Value Date   HGBA1C 6.0 (H) 08/07/2019   Assessment/Plan 1. Annual physical exam - performed today -has mammogram later - EKG 12-Lead done today:  Sinus bradycardia at 50bpm, no acute ischemia or infarct - CBC with Differential/Platelet; Future - COMPLETE METABOLIC PANEL WITH GFR; Future - Hemoglobin A1c; Future - Lipid panel; Future  2. Mixed hyperlipidemia - resume zocor 20mg  (she had stopped  and thought maybe it was causing her mouth ulcers, but they're improved now so she's willing to try resuming) -if ulcers get worse, would try alternative statin like crestor or lipitor -also was not at goal due to skipping for a long time now - Lipid panel; Future  3. Hypertension, renal disease, stage 1-4 or unspecified chronic kidney disease -bp is well-controlled, cont losartan for renal protection also  4. 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  Left leg laceration about 2 inches long -cleansed with saline -central area still bleeding--happened yesterday -applied antibiotic ointment and nonadherent dressing and wrapped with kerlix  Labs/tests ordered:   Lab Orders     CBC with Differential/Platelet     COMPLETE METABOLIC PANEL WITH GFR     Hemoglobin A1c     Lipid panel EKG  F/u for annual exam in 1 year with fasting labs before  Daya Dutt L. Jillian Warth, D.O. [facility] [address], [place] [address] Cell Phone (Mon-Fri 8am-5pm):  [phone] On Call:  [phone] & follow prompts after 5pm & weekends Office Phone:  [phone] Office Fax:  [phone]

## 2019-08-09 NOTE — Progress Notes (Signed)
Sinus bradycardia at 50bpm.  I don't see the lateral infarct mentioned as old.  Done for annual exam.  No acute ischemia or infarct.

## 2019-10-04 ENCOUNTER — Encounter: Payer: Self-pay | Admitting: Internal Medicine

## 2019-10-04 DIAGNOSIS — E782 Mixed hyperlipidemia: Secondary | ICD-10-CM

## 2019-10-06 MED ORDER — ROSUVASTATIN CALCIUM 10 MG PO TABS: 10.0000 mg | ORAL_TABLET | Freq: Every day | ORAL | 3 refills | Status: DC

## 2019-10-29 ENCOUNTER — Other Ambulatory Visit: Payer: Self-pay | Admitting: Internal Medicine

## 2019-10-29 DIAGNOSIS — K121 Other forms of stomatitis: Secondary | ICD-10-CM

## 2019-10-29 NOTE — Telephone Encounter (Signed)
rx sent to pharmacy by e-script  

## 2019-11-14 ENCOUNTER — Other Ambulatory Visit: Payer: Self-pay | Admitting: Internal Medicine

## 2019-12-25 ENCOUNTER — Other Ambulatory Visit (HOSPITAL_COMMUNITY): Payer: Self-pay | Admitting: Adult Health

## 2019-12-25 DIAGNOSIS — Z1231 Encounter for screening mammogram for malignant neoplasm of breast: Secondary | ICD-10-CM

## 2020-01-03 ENCOUNTER — Encounter: Payer: Self-pay | Admitting: Internal Medicine

## 2020-01-03 ENCOUNTER — Other Ambulatory Visit: Payer: Self-pay | Admitting: Internal Medicine

## 2020-01-03 DIAGNOSIS — K121 Other forms of stomatitis: Secondary | ICD-10-CM

## 2020-01-03 MED ORDER — TRIAMCINOLONE ACETONIDE 0.1 % EX CREA: 1.0000 "application " | TOPICAL_CREAM | Freq: Two times a day (BID) | CUTANEOUS | 1 refills | Status: DC

## 2020-01-03 NOTE — Telephone Encounter (Signed)
Message routed to Reed, Tiffany L, DO  

## 2020-02-02 ENCOUNTER — Other Ambulatory Visit: Payer: Self-pay | Admitting: Internal Medicine

## 2020-02-02 DIAGNOSIS — K121 Other forms of stomatitis: Secondary | ICD-10-CM

## 2020-02-04 NOTE — Telephone Encounter (Signed)
rx sent to pharmacy by e-script  

## 2020-02-26 ENCOUNTER — Other Ambulatory Visit: Payer: Self-pay | Admitting: Adult Health

## 2020-04-23 ENCOUNTER — Other Ambulatory Visit: Payer: Self-pay | Admitting: Adult Health

## 2020-05-17 ENCOUNTER — Other Ambulatory Visit: Payer: Self-pay | Admitting: Internal Medicine

## 2020-05-17 DIAGNOSIS — K121 Other forms of stomatitis: Secondary | ICD-10-CM

## 2020-05-19 NOTE — Telephone Encounter (Signed)
Patient is requesting refill on medication "Colchicine 0.6mg ". Last refill date was 02/04/2020. Patient was given 60 tablets to be taken 2 times daily with 2 additional refills. Medication pend and sent to provider Renato Gails, Tiffany L, DO . Please Advise.

## 2020-05-21 ENCOUNTER — Other Ambulatory Visit: Payer: Self-pay | Admitting: Internal Medicine

## 2020-05-21 DIAGNOSIS — K121 Other forms of stomatitis: Secondary | ICD-10-CM

## 2020-05-22 NOTE — Telephone Encounter (Signed)
rx sent to pharmacy by e-script  

## 2020-07-08 ENCOUNTER — Encounter: Payer: Self-pay | Admitting: Internal Medicine

## 2020-07-09 ENCOUNTER — Other Ambulatory Visit: Payer: Self-pay

## 2020-07-09 ENCOUNTER — Encounter: Payer: Self-pay | Admitting: Family

## 2020-07-09 ENCOUNTER — Ambulatory Visit (INDEPENDENT_AMBULATORY_CARE_PROVIDER_SITE_OTHER): Payer: Managed Care, Other (non HMO) | Admitting: Family

## 2020-07-09 VITALS — BP 130/86 | HR 64 | Temp 98.0°F | Resp 16 | Ht 63.0 in | Wt 149.6 lb

## 2020-07-09 DIAGNOSIS — R399 Unspecified symptoms and signs involving the genitourinary system: Secondary | ICD-10-CM | POA: Diagnosis not present

## 2020-07-09 LAB — POCT URINALYSIS DIPSTICK
Bilirubin, UA: NEGATIVE
Glucose, UA: NEGATIVE
Ketones, UA: NEGATIVE
Nitrite, UA: POSITIVE
Protein, UA: NEGATIVE
Spec Grav, UA: 1.01 (ref 1.010–1.025)
Urobilinogen, UA: NEGATIVE E.U./dL — AB
pH, UA: 6.5 (ref 5.0–8.0)

## 2020-07-09 MED ORDER — CRANBERRY 475 MG PO CAPS: 475.0000 mg | ORAL_CAPSULE | Freq: Two times a day (BID) | ORAL | 0 refills | Status: AC

## 2020-07-09 NOTE — Progress Notes (Signed)
Provider: Raveen Wieseler FNP-C  Gayland Curry, DO  Patient Care Team: Gayland Curry DO as PCP - General (Geriatric Medicine)  Extended Emergency Contact Information Primary Emergency Contact: Rivenbark,Don Address: [place], [place] Phone: [phone] Work Phone: [phone] Relation: Spouse  Code Status:  Full Code  Goals of care: Advanced Directive information Advanced Directives 07/09/2020  Does Patient Have a Medical Advance Directive? No  Type of Advance Directive -  Does patient want to make changes to medical advance directive? No - Patient declined  Copy of [place] in Chart? -  Would patient like information on creating a medical advance directive? -     Chief Complaint  Patient presents with  . Acute Visit    Possible UTI x 1 Day.    HPI:  Pt is a 60 y.o. female seen today for an acute visit for evaluation of possible UTI x 1 day.states yesterday after coming from work was watching her two grandson but noticed she had to use the bathroom more frequently almost every 10 minutes and had some little pain and little clots.last evening had some urgency but none today. She denies any lower abdominal pain,nausea,vomiting,fever or chills.states has chronic right lower back pain which runs down to her right leg not new to her.thinks due to prolong standing working at [employer].   Past Medical History:  Diagnosis Date  . Anxiety   . Depression   . Hyperlipidemia LDL goal < 100   . Hypertension   . Hypertension, renal disease    HTN related to a kidney disorder  . Nephrotic syndrome   . Renal disorder    Past Surgical History:  Procedure Laterality Date  . RENAL BIOPSY  2013   [facility]  . TUBAL LIGATION  1993    Allergies  Allergen Reactions  . Ace Inhibitors Cough    Outpatient Encounter Medications as of 07/09/2020  Medication Sig  . acetaminophen (TYLENOL) 325 MG tablet  Take 650 mg by mouth every 6 (six) hours as needed.  Marland Kitchen albuterol (PROAIR HFA) 108 (90 Base) MCG/ACT inhaler INHALE 2 PUFFS INTO THE LUNGS EVERY SIX HOURS AS NEEDED FOR WHEEZING OR SHORTNESS OF BREATH  . ALPRAZolam (XANAX) 0.5 MG tablet TAKE ONE TABLET BY MOUTH EVERY 8 HOURS AS NEEDED FOR ANXIETY  . citalopram (CELEXA) 20 MG tablet TAKE ONE TABLET BY MOUTH DAILY  . colchicine 0.6 MG tablet TAKE TWO TABLETS BY MOUTH DAILY  . losartan (COZAAR) 100 MG tablet TAKE ONE-HALF TABLET BY MOUTH DAILY  . Multiple Vitamin (MULTIVITAMIN) tablet Take 1 tablet by mouth daily.  . rosuvastatin (CRESTOR) 10 MG tablet Take 1 tablet (10 mg total) by mouth daily.  Marland Kitchen triamcinolone cream (KENALOG) 0.1 % Apply 1 application topically 2 (two) times daily.   No facility-administered encounter medications on file as of 07/09/2020.    Review of Systems  Constitutional: Negative for appetite change, chills, fatigue and fever.  Respiratory: Negative for cough, chest tightness, shortness of breath and wheezing.   Cardiovascular: Negative for chest pain, palpitations and leg swelling.  Gastrointestinal: Negative for abdominal distention, abdominal pain, diarrhea, nausea and vomiting.  Genitourinary: Positive for dysuria, frequency, hematuria and urgency. Negative for difficulty urinating and flank pain.  Musculoskeletal: Positive for back pain. Negative for gait problem and myalgias.  Neurological: Negative for dizziness, seizures, speech difficulty, weakness, light-headedness, numbness and headaches.  Hematological: Does not  bruise/bleed easily.  Psychiatric/Behavioral: Negative for agitation, confusion and sleep disturbance. The patient is not nervous/anxious.     Immunization History  Administered Date(s) Administered  . Influenza-Unspecified 05/05/2018  . Tdap 06/18/2013   Pertinent  Health Maintenance Due  Topic Date Due  . PAP SMEAR-Modifier  08/03/2021  . MAMMOGRAM  08/08/2021  . COLONOSCOPY (Pts 45-10yrs  Insurance coverage will need to be confirmed)  08/25/2023  . INFLUENZA VACCINE  Discontinued   Fall Risk  07/09/2020 08/09/2019 08/03/2018 08/01/2017 04/08/2016  Falls in the past year? 0 0 0 No No  Number falls in past yr: 0 0 0 - -  Injury with Fall? 0 0 0 - -   Functional Status Survey:    Vitals:   07/09/20 1418  BP: 130/86  Pulse: 64  Resp: 16  Temp: 98 F (36.7 C)  SpO2: 96%  Weight: 149 lb 9.6 oz (67.9 kg)  Height: 5\' 3"  (1.6 m)   Body mass index is 26.5 kg/m. Physical Exam Vitals reviewed.  Constitutional:      General: She is not in acute distress.    Appearance: She is overweight. She is not ill-appearing.  Cardiovascular:     Rate and Rhythm: Normal rate and regular rhythm.     Pulses: Normal pulses.     Heart sounds: Normal heart sounds. No murmur heard. No friction rub. No gallop.   Pulmonary:     Effort: Pulmonary effort is normal. No respiratory distress.     Breath sounds: Normal breath sounds. No wheezing, rhonchi or rales.  Chest:     Chest wall: No tenderness.  Abdominal:     General: Bowel sounds are normal. There is no distension.     Palpations: Abdomen is soft. There is no mass.     Tenderness: There is no abdominal tenderness. There is no right CVA tenderness, left CVA tenderness, guarding or rebound.  Musculoskeletal:        General: No swelling or tenderness. Normal range of motion.     Right lower leg: No edema.     Left lower leg: No edema.  Neurological:     Mental Status: She is alert and oriented to person, place, and time.     Motor: No weakness.     Gait: Gait normal.  Psychiatric:        Mood and Affect: Mood normal.        Behavior: Behavior normal.        Thought Content: Thought content normal.        Judgment: Judgment normal.     Labs reviewed: Recent Labs    08/07/19 0810  NA 140  K 4.1  CL 104  CO2 29  GLUCOSE 97  BUN 18  CREATININE 0.89  CALCIUM 9.0   Recent Labs    08/07/19 0810  AST 14  ALT 18  BILITOT  0.5  PROT 7.0   Recent Labs    08/07/19 0810  WBC 6.7  NEUTROABS 3,270  HGB 13.3  HCT 38.6  MCV 88.7  PLT 253   No results found for: TSH Lab Results  Component Value Date   HGBA1C 6.0 (H) 08/07/2019   Lab Results  Component Value Date   CHOL 197 08/07/2019   HDL 49 (L) 08/07/2019   LDLCALC 125 (H) 08/07/2019   TRIG 118 08/07/2019   CHOLHDL 4.0 08/07/2019    Significant Diagnostic Results in last 30 days:  No results found.  Assessment/Plan   Symptoms of  urinary tract infection Afebrile.Negative exam findings.  - POC Urinalysis Dipstick indicates yellow cloudy urine with large blood,Leukocytes 3+ and positive for nitrites.Discussed urine results with patient to send for culture and sensitivity.made aware that culture takes 3 days option given to start on broad spectrum antibiotics but was notified if cultures and sensitivity results comes back and indicates a different antibiotic will need to discontinue current antibiotic and start on another one.prefers to wait for final urine culture and sensitivity. - advised to take Cranberry tablet one by mouth twice daily in the meantime to alleviate symptoms. - Culture, Urine - Cranberry 475 MG CAPS; Take 1 capsule (475 mg total) by mouth 2 (two) times daily.  Dispense: 60 capsule; Refill: 0 - Increase water intake to 6-8 glasses daily  - Notify provider if running any fever > 100.5 or having any chills or symptoms worsen - additional UTI education information provided on AVS   Family/ staff Communication: Reviewed plan of care with patient verbalized understanding.   Labs/tests ordered: - Culture, Urine  Next Appointment: As needed if symptoms worsen or fail to improve.   Caesar Bookman, NP

## 2020-07-09 NOTE — Patient Instructions (Signed)
-   Increase water intake to 6-8 glasses daily  - Notify provider if running any fever > 100.5 or having any chills or symptoms worsen -  Urine culture send to lab will call you in three days with results.    Acute Urinary Retention, Female  Acute urinary retention means that you cannot pee (urinate) at all, or that you pee too little and your bladder is not emptied completely. If it is not treated, it can lead to kidney damage or other serious problems. Follow these instructions at home:  Take over-the-counter and prescription medicines only as told by your doctor. Ask your doctor what medicines you should stay away from. Do not take any medicine unless your doctor says it is okay to do so.  If you were sent home with a tube that drains pee from the bladder (catheter), take care of it as told by your doctor.  Drink enough fluid to keep your pee clear or pale yellow.  If you were given an antibiotic, take it as told by your doctor. Do not stop taking the antibiotic even if you start to feel better.  Do not use any products that contain nicotine or tobacco, such as cigarettes and e-cigarettes. If you need help quitting, ask your doctor.  Watch for changes in your symptoms. Tell your doctor about them.  If told, keep track of any changes in your blood pressure at home. Tell your doctor about them.  Keep all follow-up visits as told by your doctor. This is important. Contact a doctor if:  You have spasms or you leak pee when you have spasms. Get help right away if:  You have chills or a fever.  You have blood in your pee.  You have a tube that drains the bladder and: ? The tube stops draining pee. ? The tube falls out. Summary  Acute urinary retention means that you cannot pee at all, or that you pee too little and your bladder is not emptied completely. If it is not treated, it can result in kidney damage or other serious problems.  If you were sent home with a tube that drains  pee from the bladder, take care of it as told by your doctor.  Pay attention to any changes in your symptoms. Tell your doctor about them. This information is not intended to replace advice given to you by your health care provider. Make sure you discuss any questions you have with your health care provider. Document Revised: 06/03/2017 Document Reviewed: 07/23/2016 Elsevier Patient Education  2020 [employer].

## 2020-07-11 LAB — URINE CULTURE
MICRO NUMBER:: 11388796
SPECIMEN QUALITY:: ADEQUATE

## 2020-07-14 ENCOUNTER — Other Ambulatory Visit: Payer: Self-pay

## 2020-07-14 ENCOUNTER — Telehealth: Payer: Self-pay | Admitting: *Deleted

## 2020-07-14 DIAGNOSIS — R399 Unspecified symptoms and signs involving the genitourinary system: Secondary | ICD-10-CM

## 2020-07-14 MED ORDER — AMOXICILLIN-POT CLAVULANATE 500-125 MG PO TABS: 1.0000 | ORAL_TABLET | Freq: Two times a day (BID) | ORAL | 0 refills | Status: DC

## 2020-07-14 NOTE — Telephone Encounter (Signed)
Dinah has sent Rx to pharmacy.

## 2020-07-14 NOTE — Telephone Encounter (Signed)
-----   Message from Caesar Bookman, NP sent at 07/13/2020 10:54 PM EST ----- Final urine culture showed > 100,000 colonies of E.Coli urinary tract infection.Start on Augmentin 500 mg tablet one by mouth twice daily x 7 days. Take along with over the counter Probiotics to prevent antibiotic associated diarrhea.(or Florastor 250 mg capsule one by mouth twice daily x 10 days).

## 2020-08-08 ENCOUNTER — Other Ambulatory Visit: Payer: Managed Care, Other (non HMO)

## 2020-08-08 ENCOUNTER — Other Ambulatory Visit: Payer: Self-pay

## 2020-08-08 DIAGNOSIS — Z0000 Encounter for general adult medical examination without abnormal findings: Secondary | ICD-10-CM

## 2020-08-08 DIAGNOSIS — E782 Mixed hyperlipidemia: Secondary | ICD-10-CM

## 2020-08-08 DIAGNOSIS — R739 Hyperglycemia, unspecified: Secondary | ICD-10-CM

## 2020-08-09 LAB — COMPLETE METABOLIC PANEL WITH GFR
AG Ratio: 1.5 (calc) (ref 1.0–2.5)
ALT: 36 U/L — ABNORMAL HIGH (ref 6–29)
AST: 39 U/L — ABNORMAL HIGH (ref 10–35)
Albumin: 4 g/dL (ref 3.6–5.1)
Alkaline phosphatase (APISO): 108 U/L (ref 37–153)
BUN: 20 mg/dL (ref 7–25)
CO2: 26 mmol/L (ref 20–32)
Calcium: 9.1 mg/dL (ref 8.6–10.4)
Chloride: 103 mmol/L (ref 98–110)
Creat: 0.88 mg/dL (ref 0.50–0.99)
GFR, Est African American: 82 mL/min/{1.73_m2} (ref >=60)
GFR, Est Non African American: 71 mL/min/{1.73_m2} (ref >=60)
Globulin: 2.7 g/dL (calc) (ref 1.9–3.7)
Glucose, Bld: 97 mg/dL (ref 65–99)
Potassium: 4.3 mmol/L (ref 3.5–5.3)
Sodium: 138 mmol/L (ref 135–146)
Total Bilirubin: 1 mg/dL (ref 0.2–1.2)
Total Protein: 6.7 g/dL (ref 6.1–8.1)

## 2020-08-09 LAB — LIPID PANEL
Cholesterol: 130 mg/dL (ref <200)
HDL: 40 mg/dL — ABNORMAL LOW (ref >=50)
LDL Cholesterol (Calc): 72 mg/dL (calc)
Non-HDL Cholesterol (Calc): 90 mg/dL (calc) (ref <130)
Total CHOL/HDL Ratio: 3.3 (calc) (ref <5.0)
Triglycerides: 93 mg/dL (ref <150)

## 2020-08-09 LAB — CBC WITH DIFFERENTIAL/PLATELET
Absolute Monocytes: 449 cells/uL (ref 200–950)
Basophils Absolute: 59 cells/uL (ref 0–200)
Basophils Relative: 0.9 %
Eosinophils Absolute: 535 cells/uL — ABNORMAL HIGH (ref 15–500)
Eosinophils Relative: 8.1 %
HCT: 38.3 % (ref 35.0–45.0)
Hemoglobin: 12.9 g/dL (ref 11.7–15.5)
Lymphs Abs: 2508 cells/uL (ref 850–3900)
MCH: 30 pg (ref 27.0–33.0)
MCHC: 33.7 g/dL (ref 32.0–36.0)
MCV: 89.1 fL (ref 80.0–100.0)
MPV: 9.9 fL (ref 7.5–12.5)
Monocytes Relative: 6.8 %
Neutro Abs: 3049 cells/uL (ref 1500–7800)
Neutrophils Relative %: 46.2 %
Platelets: 230 10*3/uL (ref 140–400)
RBC: 4.3 10*6/uL (ref 3.80–5.10)
RDW: 12.3 % (ref 11.0–15.0)
Total Lymphocyte: 38 %
WBC: 6.6 10*3/uL (ref 3.8–10.8)

## 2020-08-09 LAB — HEMOGLOBIN A1C
Hgb A1c MFr Bld: 6 % of total Hgb — ABNORMAL HIGH (ref <5.7)
Mean Plasma Glucose: 126 mg/dL
eAG (mmol/L): 7 mmol/L

## 2020-08-11 ENCOUNTER — Ambulatory Visit (INDEPENDENT_AMBULATORY_CARE_PROVIDER_SITE_OTHER): Payer: Managed Care, Other (non HMO) | Admitting: Internal Medicine

## 2020-08-11 ENCOUNTER — Encounter: Payer: Self-pay | Admitting: Adult Health

## 2020-08-11 ENCOUNTER — Ambulatory Visit (INDEPENDENT_AMBULATORY_CARE_PROVIDER_SITE_OTHER): Payer: Managed Care, Other (non HMO) | Admitting: Adult Health

## 2020-08-11 ENCOUNTER — Ambulatory Visit (HOSPITAL_COMMUNITY)
Admission: RE | Admit: 2020-08-11 | Discharge: 2020-08-11 | Disposition: A | Discharge status: Home / Self Care | Payer: Managed Care, Other (non HMO) | Source: Ambulatory Visit | Attending: Adult Health | Admitting: Adult Health

## 2020-08-11 ENCOUNTER — Other Ambulatory Visit: Payer: Self-pay

## 2020-08-11 ENCOUNTER — Encounter: Payer: Self-pay | Admitting: Internal Medicine

## 2020-08-11 ENCOUNTER — Other Ambulatory Visit (HOSPITAL_COMMUNITY)
Admission: RE | Admit: 2020-08-11 | Discharge: 2020-08-11 | Disposition: A | Discharge status: Home / Self Care | Payer: Managed Care, Other (non HMO) | Source: Ambulatory Visit | Attending: Adult Health | Admitting: Adult Health

## 2020-08-11 VITALS — BP 131/82 | HR 60 | Temp 97.1°F | Ht 63.0 in | Wt 145.0 lb

## 2020-08-11 VITALS — BP 144/71 | HR 52 | Ht 62.5 in | Wt 144.0 lb

## 2020-08-11 DIAGNOSIS — Z01419 Encounter for gynecological examination (general) (routine) without abnormal findings: Secondary | ICD-10-CM | POA: Insufficient documentation

## 2020-08-11 DIAGNOSIS — F32A Depression, unspecified: Secondary | ICD-10-CM

## 2020-08-11 DIAGNOSIS — R7401 Elevation of levels of liver transaminase levels: Secondary | ICD-10-CM | POA: Diagnosis not present

## 2020-08-11 DIAGNOSIS — Z0000 Encounter for general adult medical examination without abnormal findings: Secondary | ICD-10-CM

## 2020-08-11 DIAGNOSIS — I129 Hypertensive chronic kidney disease with stage 1 through stage 4 chronic kidney disease, or unspecified chronic kidney disease: Secondary | ICD-10-CM

## 2020-08-11 DIAGNOSIS — K121 Other forms of stomatitis: Secondary | ICD-10-CM | POA: Diagnosis not present

## 2020-08-11 DIAGNOSIS — E782 Mixed hyperlipidemia: Secondary | ICD-10-CM

## 2020-08-11 DIAGNOSIS — R739 Hyperglycemia, unspecified: Secondary | ICD-10-CM

## 2020-08-11 DIAGNOSIS — Z87440 Personal history of urinary (tract) infections: Secondary | ICD-10-CM

## 2020-08-11 DIAGNOSIS — Z1211 Encounter for screening for malignant neoplasm of colon: Secondary | ICD-10-CM

## 2020-08-11 DIAGNOSIS — Z1231 Encounter for screening mammogram for malignant neoplasm of breast: Secondary | ICD-10-CM | POA: Insufficient documentation

## 2020-08-11 LAB — POCT URINALYSIS DIPSTICK
Blood, UA: NEGATIVE
Glucose, UA: NEGATIVE
Ketones, UA: NEGATIVE
Leukocytes, UA: NEGATIVE
Nitrite, UA: NEGATIVE
Protein, UA: NEGATIVE

## 2020-08-11 LAB — HEMOCCULT GUIAC POC 1CARD (OFFICE): Fecal Occult Blood, POC: NEGATIVE

## 2020-08-11 NOTE — Progress Notes (Signed)
Provider:  Gwenith Spitz. Renato Gails, D.O., C.M.D. Location:    [facility]   Place of Service:   clinic  Previous PCP: Kermit Balo, DO Patient Care Team: Kermit Balo, DO as PCP - General (Geriatric Medicine)  Extended Emergency Contact Information Primary Emergency Contact: Goldsmith,Don Address: [address]          [place], [place] Home Phone: [phone] Work Phone: [phone] Relation: Spouse  Goals of Care: Advanced Directive information Advanced Directives 08/11/2020  Does Patient Have a Medical Advance Directive? No  Type of Advance Directive -  Does patient want to make changes to medical advance directive? No - Patient declined  Copy of Healthcare Power of Attorney in Chart? -  Would patient like information on creating a medical advance directive? -    Chief Complaint  Patient presents with  . Annual Exam    Annual Exam     HPI: Patient is a 60 y.o. female seen today for an annual physical exam.  Tolerating crestor well.  Cholesterol coming down.    Behcet's--takes colchicine two a day regularly.  Will use salve if she gets one, but it doesn't really heal them.  Knows a cold is coming if she does get them.  Has an area on her right SI joint she feels.  Never goes down her leg.    Had her covid vaccines last in October.  Has not done her booster.    cscope not due until 2025.    Has mammo and pap with gyn later today.  Past Medical History:  Diagnosis Date  . Anxiety   . Depression   . Hyperlipidemia LDL goal < 100   . Hypertension   . Hypertension, renal disease    HTN related to a kidney disorder  . Nephrotic syndrome   . Renal disorder    Past Surgical History:  Procedure Laterality Date  . RENAL BIOPSY  2013   [name]  . TUBAL LIGATION  1993    reports that she quit smoking about 38 years ago. Her smoking use included cigarettes. She has a 45.00 pack-year smoking history. She has never used smokeless tobacco. She  reports that she does not drink alcohol and does not use drugs.  Functional Status Survey:    Family History  Problem Relation Age of Onset  . Heart disease Mother 46  . Parkinson's disease Father   . Asthma Sister   . Hypertension Sister   . Lymphoma Sister 61  . Hypertension Brother   . Heart disease Brother 45       heart attack  . Hypertension Sister   . Hypertension Brother   . Colon cancer Neg Hx     Health Maintenance  Topic Date Due  . COVID-19 Vaccine (3 - Booster for Pfizer series) 11/01/2020  . PAP SMEAR-Modifier  08/03/2021  . MAMMOGRAM  08/08/2021  . TETANUS/TDAP  06/19/2023  . COLONOSCOPY (Pts 45-63yrs Insurance coverage will need to be confirmed)  08/25/2023  . Hepatitis C Screening  Completed  . HIV Screening  Completed  . INFLUENZA VACCINE  Discontinued    Allergies  Allergen Reactions  . Ace Inhibitors Cough    Outpatient Encounter Medications as of 08/11/2020  Medication Sig  . acetaminophen (TYLENOL) 325 MG tablet Take 650 mg by mouth every 6 (six) hours as needed.  Marland Kitchen albuterol (PROAIR HFA) 108 (90 Base) MCG/ACT inhaler INHALE 2 PUFFS INTO THE LUNGS EVERY SIX HOURS AS NEEDED FOR WHEEZING  OR SHORTNESS OF BREATH  . ALPRAZolam (XANAX) 0.5 MG tablet TAKE ONE TABLET BY MOUTH EVERY 8 HOURS AS NEEDED FOR ANXIETY  . citalopram (CELEXA) 20 MG tablet TAKE ONE TABLET BY MOUTH DAILY  . colchicine 0.6 MG tablet TAKE TWO TABLETS BY MOUTH DAILY  . losartan (COZAAR) 100 MG tablet TAKE ONE-HALF TABLET BY MOUTH DAILY  . Multiple Vitamin (MULTIVITAMIN) tablet Take 1 tablet by mouth daily.  . rosuvastatin (CRESTOR) 10 MG tablet Take 1 tablet (10 mg total) by mouth daily.  Marland Kitchen triamcinolone cream (KENALOG) 0.1 % Apply 1 application topically 2 (two) times daily.  . [EXPIRED] Cranberry 475 MG CAPS Take 1 capsule (475 mg total) by mouth 2 (two) times daily.  . [DISCONTINUED] amoxicillin-clavulanate (AUGMENTIN) 500-125 MG tablet Take 1 tablet (500 mg total) by mouth in the  morning and at bedtime.   No facility-administered encounter medications on file as of 08/11/2020.    Review of Systems  Constitutional: Negative for chills, fever and malaise/fatigue.  HENT: Negative for congestion, hearing loss and sore throat.   Eyes: Negative for blurred vision.  Respiratory: Negative for cough and shortness of breath.   Cardiovascular: Negative for chest pain, palpitations and leg swelling.  Gastrointestinal: Negative for abdominal pain, blood in stool, constipation and melena.  Genitourinary: Negative for dysuria.  Musculoskeletal: Negative for falls, joint pain and myalgias.       Right posterior hip/SI region pain  Neurological: Negative for dizziness and loss of consciousness.  Endo/Heme/Allergies: Does not bruise/bleed easily.  Psychiatric/Behavioral: Negative for depression and memory loss. The patient is not nervous/anxious.     Vitals:   08/11/20 0907  BP: 131/82  Pulse: 60  Temp: (!) 97.1 F (36.2 C)  TempSrc: Temporal  SpO2: 97%  Weight: 145 lb (65.8 kg)  Height: 5\' 3"  (1.6 m)   Body mass index is 25.69 kg/m. Physical Exam Vitals reviewed.  Constitutional:      General: She is not in acute distress.    Appearance: Normal appearance. She is not toxic-appearing.  HENT:     Head: Normocephalic and atraumatic.     Right Ear: Tympanic membrane, ear canal and external ear normal.     Left Ear: Tympanic membrane, ear canal and external ear normal.     Nose: Nose normal.     Mouth/Throat:     Pharynx: Oropharynx is clear.  Eyes:     Conjunctiva/sclera: Conjunctivae normal.     Pupils: Pupils are equal, round, and reactive to light.  Cardiovascular:     Rate and Rhythm: Normal rate and regular rhythm.     Pulses: Normal pulses.     Heart sounds: Normal heart sounds.  Pulmonary:     Effort: Pulmonary effort is normal.     Breath sounds: Normal breath sounds. No wheezing, rhonchi or rales.  Abdominal:     General: Bowel sounds are normal.  There is no distension.     Palpations: Abdomen is soft.     Tenderness: There is no abdominal tenderness. There is no guarding or rebound.  Musculoskeletal:        General: Normal range of motion.     Cervical back: Neck supple.     Right lower leg: No edema.     Left lower leg: No edema.  Lymphadenopathy:     Cervical: No cervical adenopathy.  Skin:    General: Skin is warm and dry.  Neurological:     General: No focal deficit present.  Mental Status: She is alert and oriented to person, place, and time.  Psychiatric:        Mood and Affect: Mood normal.        Behavior: Behavior normal.        Thought Content: Thought content normal.        Judgment: Judgment normal.     Labs reviewed: Basic Metabolic Panel: Recent Labs    08/08/20 0807  NA 138  K 4.3  CL 103  CO2 26  GLUCOSE 97  BUN 20  CREATININE 0.88  CALCIUM 9.1   Liver Function Tests: Recent Labs    08/08/20 0807  AST 39*  ALT 36*  BILITOT 1.0  PROT 6.7   No results for input(s): LIPASE, AMYLASE in the last 8760 hours. No results for input(s): AMMONIA in the last 8760 hours. CBC: Recent Labs    08/08/20 0807  WBC 6.6  NEUTROABS 3,049  HGB 12.9  HCT 38.3  MCV 89.1  PLT 230   Cardiac Enzymes: No results for input(s): CKTOTAL, CKMB, CKMBINDEX, TROPONINI in the last 8760 hours. BNP: Invalid input(s): POCBNP Lab Results  Component Value Date   HGBA1C 6.0 (H) 08/08/2020    Assessment/Plan 1. Annual physical exam -performed today, sees gyn for her breast exam, pap and pelvic later in day  2. Transaminitis -noted new on labs, f/u liver panel in 6 wks--may be due to mix of colchicine and crestor - Hepatic function panel; Future  3. 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 Hypertension, renal disease, stage 1-4 or unspecified chronic kidney disease -bp well controlled, cont same routine and monitor  5. Mixed hyperlipidemia -much better  when taking crestor  6. Hyperglycemia -stable hba1c of 6, cont healthy diet and exercise  F/u in 1 yr for CPE, but 6 wks for nonfasting labs  Lunden Stieber L. Abdulloh Ullom, D.O. Geriatrics [facility] [address] [place], [place] [address] Cell Phone (Mon-Fri 8am-5pm):  [phone] On Call:  [phone] & follow prompts after 5pm & weekends Office Phone:  [phone] Office Fax:  [phone]

## 2020-08-11 NOTE — Progress Notes (Signed)
Patient ID: Cassandra Pineda, female   DOB: 25-Jan-1961, 60 y.o.   MRN: [id number] History of Present Illness: Cassandra Pineda is a 60 year old white female,married, PM in for a well woman gyn exam and pap. Has labs and liver enzymes slightly elevated, will recheck in 6 weeks per PCP. Cassandra Pineda is still working.  PCP is Dr Renato Gails.   Current Medications, Allergies, Past Medical History, Past Surgical History, Family History and Social History were reviewed in [employer] record.     Review of Systems: Patient denies any headaches, hearing loss, fatigue, blurred vision, shortness of breath, chest pain, abdominal pain, problems with bowel movements, urination, or intercourse.(not active) No joint pain or mood swings.    Physical Exam:BP (!) 144/71 (BP Location: Left Arm, Patient Position: Sitting, Cuff Size: Normal)   Pulse (!) 52   Ht 5' 2.5" (1.588 m)   Wt 144 lb (65.3 kg)   BMI 25.92 kg/m  Urine dipstick was negative  General:  Well developed, well nourished, no acute distress Skin:  Warm and dry Neck:  Midline trachea, normal thyroid, good ROM, no lymphadenopathy,no carotid bruits heard Lungs; Clear to auscultation bilaterally Breast:  No dominant palpable mass, retraction, or nipple discharge Cardiovascular: Regular rate and rhythm Abdomen:  Soft, non tender, no hepatosplenomegaly Pelvic:  External genitalia is normal in appearance, no lesions.  The vagina is pale with loss of moisture and rugae. Urethra has no lesions or masses. The cervix is smooth, pap with HRHPV performed. Uterus is felt to be normal size, shape, and contour.  No adnexal masses or tenderness noted.Bladder is non tender, no masses felt. Rectal: Good sphincter tone, no polyps, or hemorrhoids felt.  Hemoccult negative. Extremities/musculoskeletal:  No swelling or varicosities noted, no clubbing or cyanosis Psych:  No mood changes, alert and cooperative,seems happy AA is 0 Fall risk is low PHQ 9 score is  0, is on celexa GAD 7 score is 0  Upstream - 08/11/20 1339      Contraception Wrap Up   Current Method Female Sterilization    End Method Female Sterilization    Contraception Counseling Provided No         Examination chaperoned by Malachy Mood LPN  Impression and Plan: 1. Encounter for gynecological examination with Papanicolaou smear of cervix Pap sent Physical in 1 year Pap in 3 if normal Labs with PCP  Mammogram yearly Colonoscopy per GI   2. History of UTI  3. Encounter for screening fecal occult blood testing  4. Depression, unspecified depression type Continue celexa 20 mg 1 daily  has refills

## 2020-08-14 LAB — CYTOLOGY - PAP
Comment: NEGATIVE
Diagnosis: NEGATIVE
High risk HPV: NEGATIVE

## 2020-08-25 ENCOUNTER — Encounter: Payer: Self-pay | Admitting: Internal Medicine

## 2020-09-16 ENCOUNTER — Other Ambulatory Visit: Payer: Managed Care, Other (non HMO)

## 2020-09-16 ENCOUNTER — Other Ambulatory Visit: Payer: Self-pay

## 2020-09-16 DIAGNOSIS — R7401 Elevation of levels of liver transaminase levels: Secondary | ICD-10-CM

## 2020-09-16 LAB — HEPATIC FUNCTION PANEL
AG Ratio: 1.8 (calc) (ref 1.0–2.5)
ALT: 13 U/L (ref 6–29)
AST: 12 U/L (ref 10–35)
Albumin: 4.1 g/dL (ref 3.6–5.1)
Alkaline phosphatase (APISO): 112 U/L (ref 37–153)
Bilirubin, Direct: 0.1 mg/dL (ref 0.0–0.2)
Globulin: 2.3 g/dL (calc) (ref 1.9–3.7)
Indirect Bilirubin: 0.4 mg/dL (calc) (ref 0.2–1.2)
Total Bilirubin: 0.5 mg/dL (ref 0.2–1.2)
Total Protein: 6.4 g/dL (ref 6.1–8.1)

## 2020-09-17 ENCOUNTER — Encounter: Payer: Self-pay | Admitting: Internal Medicine

## 2020-09-17 NOTE — Progress Notes (Signed)
Liver panel is now normal.

## 2020-11-18 ENCOUNTER — Other Ambulatory Visit: Payer: Self-pay | Admitting: *Deleted

## 2020-11-18 DIAGNOSIS — E782 Mixed hyperlipidemia: Secondary | ICD-10-CM

## 2020-11-18 MED ORDER — ROSUVASTATIN CALCIUM 10 MG PO TABS: 10.0000 mg | ORAL_TABLET | Freq: Every day | ORAL | 3 refills | Status: DC

## 2020-11-18 NOTE — Telephone Encounter (Signed)
Patient follow up's yearly. Pharmacy is requesting refill.  Pended Rx and sent to Amy for approval due to HIGH ALERT Warning.

## 2020-12-22 ENCOUNTER — Other Ambulatory Visit (HOSPITAL_COMMUNITY): Payer: Self-pay | Admitting: Adult Health

## 2020-12-22 DIAGNOSIS — Z1231 Encounter for screening mammogram for malignant neoplasm of breast: Secondary | ICD-10-CM

## 2020-12-25 ENCOUNTER — Encounter: Payer: Self-pay | Admitting: Orthopedic Surgery

## 2020-12-25 ENCOUNTER — Other Ambulatory Visit: Payer: Self-pay

## 2020-12-25 ENCOUNTER — Ambulatory Visit: Payer: Managed Care, Other (non HMO) | Admitting: Orthopedic Surgery

## 2020-12-25 VITALS — BP 128/70 | HR 74 | Temp 97.8°F | Resp 16

## 2020-12-25 DIAGNOSIS — R0981 Nasal congestion: Secondary | ICD-10-CM

## 2020-12-25 DIAGNOSIS — J029 Acute pharyngitis, unspecified: Secondary | ICD-10-CM | POA: Diagnosis not present

## 2020-12-25 LAB — POCT RAPID STREP A (OFFICE): Rapid Strep A Screen: NEGATIVE

## 2020-12-25 MED ORDER — AZITHROMYCIN 250 MG PO TABS: ORAL_TABLET | ORAL | 0 refills | Status: AC

## 2020-12-25 MED ORDER — PREDNISONE 10 MG PO TABS: ORAL_TABLET | ORAL | 0 refills | Status: AC

## 2020-12-25 NOTE — Progress Notes (Addendum)
Careteam: Patient Care Team: Cassandra Heir, NP as PCP - General (Adult Health Nurse Practitioner)  Seen by: [name], AGNP-C  PLACE OF SERVICE:  [facility]  Advanced Directive information    Allergies  Allergen Reactions   Ace Inhibitors Cough    No chief complaint on file.    HPI: Patient is a 60 y.o. female seen today for acute visit due to sore throat.   Symptoms of sore throat, nasal congestion and chills began 06/18. Her daughter was sick with same symptoms around same time. Daughter tested negative for covid, so she did not test herself at home. She tried honey, warm fluids, salt water gargles and throat lozenges for sore throat without success. She is having trouble swallowing foods. Pain rated 4/10. Sore throat is keeping her up at night. Nasal congestion clear. She has not tried any interventions. Denies fever, dental pain, chest pain or sob.   Discussed getting 3rd covid booster when cold has resolved.   Review of Systems:  Review of Systems  Constitutional:  Positive for chills. Negative for fever, malaise/fatigue and weight loss.  HENT:  Positive for congestion and sore throat. Negative for ear pain and sinus pain.   Respiratory:  Positive for cough. Negative for sputum production, shortness of breath and wheezing.   Cardiovascular:  Negative for chest pain and leg swelling.  Gastrointestinal:  Negative for diarrhea, nausea and vomiting.  Musculoskeletal:  Negative for myalgias.  Psychiatric/Behavioral:  Negative for depression. The patient is not nervous/anxious.    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 Her smoking use included cigarettes. She has a 45.00 pack-year smoking history. She has never used smokeless tobacco. She reports that she does not drink alcohol and does not use drugs.  Family History  Problem Relation Age of Onset   Heart disease Mother 65   Parkinson's disease Father    Asthma Sister    Hypertension Sister    Lymphoma Sister 1   Hypertension Brother    Heart disease Brother 45       heart attack   Hypertension Sister    Hypertension Brother    Colon cancer Neg Hx     Medications: Patient's Medications  New Prescriptions   No medications on file  Previous Medications   ACETAMINOPHEN (TYLENOL) 325 MG TABLET    Take 650 mg by mouth every 6 (six) hours as needed.   ALBUTEROL (PROAIR HFA) 108 (90 BASE) MCG/ACT INHALER    INHALE 2 PUFFS INTO THE LUNGS EVERY SIX HOURS AS NEEDED FOR WHEEZING OR SHORTNESS OF BREATH   ALPRAZOLAM (XANAX) 0.5 MG TABLET    TAKE ONE TABLET BY MOUTH EVERY 8 HOURS AS NEEDED FOR ANXIETY   CITALOPRAM (CELEXA) 20 MG TABLET    TAKE ONE TABLET BY MOUTH DAILY   COLCHICINE 0.6 MG TABLET    TAKE TWO TABLETS BY MOUTH DAILY   LOSARTAN (COZAAR) 100 MG TABLET    TAKE ONE-HALF TABLET BY MOUTH DAILY   MULTIPLE VITAMIN (  MULTIVITAMIN) TABLET    Take 1 tablet by mouth daily.   ROSUVASTATIN (CRESTOR) 10 MG TABLET    Take 1 tablet (10 mg total) by mouth daily.   TRIAMCINOLONE CREAM (KENALOG) 0.1 %    Apply 1 application topically 2 (two) times daily.  Modified Medications   No medications on file  Discontinued Medications   No medications on file    Physical Exam:  There were no vitals filed for this visit. There is no height or weight on file to calculate BMI. Wt Readings from Last 3 Encounters:  08/11/20 144 lb (65.3 kg)  08/11/20 145 lb (65.8 kg)  07/09/20 149 lb 9.6 oz (67.9 kg)    Physical Exam Vitals reviewed.  Constitutional:      General: She is not in acute distress. HENT:     Head: Normocephalic.     Right Ear:  There is no impacted cerumen.     Left Ear: There is no impacted cerumen.     Nose: Congestion present.     Right Turbinates: Enlarged.     Left Turbinates: Enlarged.     Mouth/Throat:     Mouth: Mucous membranes are moist.     Tongue: No lesions.     Palate: No mass and lesions.     Pharynx: Pharyngeal swelling and posterior oropharyngeal erythema present. No oropharyngeal exudate or uvula swelling.     Tonsils: No tonsillar exudate or tonsillar abscesses.     Comments: Tongue coated Cardiovascular:     Rate and Rhythm: Normal rate and regular rhythm.     Pulses: Normal pulses.     Heart sounds: Normal heart sounds. No murmur heard. Pulmonary:     Effort: Pulmonary effort is normal. No respiratory distress.     Breath sounds: Normal breath sounds. No wheezing.  Musculoskeletal:     Cervical back: Normal range of motion.  Lymphadenopathy:     Cervical: No cervical adenopathy.  Skin:    General: Skin is warm and dry.  Neurological:     General: No focal deficit present.     Mental Status: She is alert and oriented to person, place, and time.  Psychiatric:        Mood and Affect: Mood normal.        Behavior: Behavior normal.    Labs reviewed: Basic Metabolic Panel: Recent Labs    08/08/20 0807  NA 138  K 4.3  CL 103  CO2 26  GLUCOSE 97  BUN 20  CREATININE 0.88  CALCIUM 9.1   Liver Function Tests: Recent Labs    08/08/20 0807 09/16/20 0824  AST 39* 12  ALT 36* 13  BILITOT 1.0 0.5  PROT 6.7 6.4   No results for input(s): LIPASE, AMYLASE in the last 8760 hours. No results for input(s): AMMONIA in the last 8760 hours. CBC: Recent Labs    08/08/20 0807  WBC 6.6  NEUTROABS 3,049  HGB 12.9  HCT 38.3  MCV 89.1  PLT 230   Lipid Panel: Recent Labs    08/08/20 0807  CHOL 130  HDL 40*  LDLCALC 72  TRIG 93  CHOLHDL 3.3   TSH: No results for input(s): TSH in the last 8760 hours. A1C: Lab Results  Component Value Date   HGBA1C 6.0 (H) 08/08/2020      Assessment/Plan 1. Sorethroat - suspect covid versus acute pharyngitis - pharynx swollen with erythema- STEP NEGATIVE 06/23 - prednisone to help with pain and swelling - recommend starting zpack if  covid test negative  - POCT rapid strep A - predniSONE (DELTASONE) 10 MG tablet; Take 4 tablets (40 mg total) by mouth daily with breakfast for 1 day, THEN 3 tablets (30 mg total) daily with breakfast for 1 day, THEN 2 tablets (20 mg total) daily with breakfast for 1 day, THEN 1 tablet (10 mg total) daily with breakfast for 1 day, THEN 0.5 tablets (5 mg total) daily with breakfast for 1 day.  Dispense: 10.5 tablet; Refill: 0 - azithromycin (ZITHROMAX) 250 MG tablet; Take 2 tablets on day 1, then 1 tablet daily on days 2 through 5  Dispense: 6 tablet; Refill: 0 - SARS-COV-2 RNA,(COVID-19) QUAL NAAT - recommend 3rd covid booster when cold resolves  2. Nasal congestion - clear drainage - recommend vitamin C 1000 mg po bid x 7 days - recommend zinc 50 mg daily - recommend saline nasal spray  Total time: 28 minutes. Greater than 50% of total time spent doing patient education on symptom management/ medication administration.   Next appt: none Taniah Reinecke Scherry Ran  [facility] [phone]

## 2020-12-25 NOTE — Patient Instructions (Signed)
May start prednisone to keep with sore throat  May start antibiotic if covid tests negative  Please take home covid test- report results to office  Vitamin C 1000 mg twice a day x 7 days Zinc 50 mg daily

## 2020-12-26 ENCOUNTER — Encounter: Payer: Self-pay | Admitting: Orthopedic Surgery

## 2020-12-26 LAB — SARS-COV-2 RNA,(COVID-19) QUALITATIVE NAAT: SARS CoV2 RNA: DETECTED — AB

## 2021-01-27 ENCOUNTER — Encounter: Payer: Self-pay | Admitting: Orthopedic Surgery

## 2021-01-30 NOTE — Telephone Encounter (Signed)
Please call the office to schedule a appointment. [phone], if there are no openings on my schedule you are welcome to see any of the other providers at your leisure. I think we should hold off on a referral until you are seen. You may purchase Orajel for canker sores at a local drug store to help with discomfort.

## 2021-02-03 ENCOUNTER — Other Ambulatory Visit: Payer: Self-pay | Admitting: *Deleted

## 2021-02-03 DIAGNOSIS — K121 Other forms of stomatitis: Secondary | ICD-10-CM

## 2021-02-03 MED ORDER — COLCHICINE 0.6 MG PO TABS: 1.2000 mg | ORAL_TABLET | Freq: Every day | ORAL | 2 refills | Status: DC

## 2021-02-03 NOTE — Telephone Encounter (Signed)
Received refill Request from pharmacy Pended Rx and sent to Amy for approval due to HIGH ALERT Warning.

## 2021-04-17 ENCOUNTER — Other Ambulatory Visit: Payer: Self-pay | Admitting: *Deleted

## 2021-04-17 MED ORDER — LOSARTAN POTASSIUM 100 MG PO TABS: 50.0000 mg | ORAL_TABLET | Freq: Every day | ORAL | 3 refills | Status: DC

## 2021-04-17 NOTE — Telephone Encounter (Signed)
Pharmacy requested refill

## 2021-05-01 ENCOUNTER — Other Ambulatory Visit: Payer: Self-pay | Admitting: Adult Health

## 2021-06-02 ENCOUNTER — Other Ambulatory Visit: Payer: Self-pay | Admitting: Adult Health

## 2021-06-08 ENCOUNTER — Encounter: Payer: Self-pay | Admitting: Orthopedic Surgery

## 2021-06-08 MED ORDER — ALBUTEROL SULFATE HFA 108 (90 BASE) MCG/ACT IN AERS: INHALATION_SPRAY | RESPIRATORY_TRACT | 3 refills | Status: DC

## 2021-06-27 ENCOUNTER — Telehealth: Payer: Managed Care, Other (non HMO) | Admitting: Nurse Practitioner

## 2021-06-27 DIAGNOSIS — R6889 Other general symptoms and signs: Secondary | ICD-10-CM | POA: Diagnosis not present

## 2021-06-27 MED ORDER — OSELTAMIVIR PHOSPHATE 75 MG PO CAPS: 75.0000 mg | ORAL_CAPSULE | Freq: Two times a day (BID) | ORAL | 0 refills | Status: AC

## 2021-06-27 NOTE — Progress Notes (Signed)

## 2021-06-27 NOTE — Progress Notes (Signed)
I have spent 5 minutes in review of e-visit questionnaire, review and updating patient chart, medical decision making and response to patient.  ° °Jahmal Dunavant W Spiros Greenfeld, NP ° °  °

## 2021-07-03 ENCOUNTER — Ambulatory Visit (INDEPENDENT_AMBULATORY_CARE_PROVIDER_SITE_OTHER): Payer: Managed Care, Other (non HMO) | Admitting: Nurse Practitioner

## 2021-07-03 ENCOUNTER — Encounter: Payer: Self-pay | Admitting: Family

## 2021-07-03 ENCOUNTER — Other Ambulatory Visit: Payer: Self-pay

## 2021-07-03 VITALS — BP 140/90 | HR 82 | Temp 99.5°F | Resp 16 | Ht 62.5 in

## 2021-07-03 DIAGNOSIS — J40 Bronchitis, not specified as acute or chronic: Secondary | ICD-10-CM

## 2021-07-03 DIAGNOSIS — R509 Fever, unspecified: Secondary | ICD-10-CM

## 2021-07-03 DIAGNOSIS — R52 Pain, unspecified: Secondary | ICD-10-CM | POA: Diagnosis not present

## 2021-07-03 DIAGNOSIS — R059 Cough, unspecified: Secondary | ICD-10-CM | POA: Diagnosis not present

## 2021-07-03 MED ORDER — DOXYCYCLINE HYCLATE 100 MG PO TABS: 100.0000 mg | ORAL_TABLET | Freq: Two times a day (BID) | ORAL | 0 refills | Status: DC

## 2021-07-03 MED ORDER — AZITHROMYCIN 250 MG PO TABS: ORAL_TABLET | ORAL | 0 refills | Status: AC

## 2021-07-03 NOTE — Progress Notes (Signed)
Careteam: Patient Care Team: Octavia Heir, NP as PCP - General (Adult Health Nurse Practitioner)  PLACE OF SERVICE:  [facility]  Advanced Directive information Does Patient Have a Medical Advance Directive?: No, Would patient like information on creating a medical advance directive?: No - Patient declined  Allergies  Allergen Reactions   Ace Inhibitors Cough    Chief Complaint  Patient presents with   Acute Visit    Patient complains body aches, cough, fever;      HPI: Patient is a 60 y.o. female for fever that goes and comes.  Did e-visit with cone on 12/24 when symptoms started and was prescribed tamiflu. She has completed course and is no different. Continues to have cough, congestion with Productive cough. Fevers on and off. Fever of 101 at some point. She went back to work but feels poorly.  Body aches.  Took an otc cold and flu and ibuprofen.  She has had the original covid vaccines but no booster.  She has had COVID twice.  Will get short of breath with increase in activities. Takes inhaler and helps.   Review of Systems:  Review of Systems  Constitutional:  Positive for chills, fever and malaise/fatigue.  HENT:  Positive for congestion. Negative for ear discharge, hearing loss, nosebleeds and tinnitus.   Respiratory:  Positive for cough, sputum production, shortness of breath (with activity) and wheezing.    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 Her smoking use included cigarettes. She has a 45.00 pack-year smoking history. She has never used smokeless tobacco. She reports that she does  not drink alcohol and does not use drugs.  Family History  Problem Relation Age of Onset   Heart disease Mother 68   Parkinson's disease Father    Asthma Sister    Hypertension Sister    Lymphoma Sister 22   Hypertension Brother    Heart disease Brother 45       heart attack   Hypertension Sister    Hypertension Brother    Colon cancer Neg Hx     Medications: Patient's Medications  New Prescriptions   No medications on file  Previous Medications   ACETAMINOPHEN (TYLENOL) 325 MG TABLET    Take 650 mg by mouth every 6 (six) hours as needed.   ALBUTEROL (PROAIR HFA) 108 (90 BASE) MCG/ACT INHALER    INHALE 2 PUFFS INTO THE LUNGS EVERY SIX HOURS AS NEEDED FOR WHEEZING OR SHORTNESS OF BREATH   ALPRAZOLAM (XANAX) 0.5 MG TABLET    TAKE ONE TABLET BY MOUTH EVERY 8 HOURS AS NEEDED FOR ANXIETY   CITALOPRAM (CELEXA) 20 MG TABLET    TAKE ONE TABLET BY MOUTH DAILY   COLCHICINE 0.6 MG TABLET    Take 2 tablets (1.2 mg total) by mouth daily.   LOSARTAN (COZAAR) 100 MG TABLET    Take 0.5 tablets (50 mg total) by mouth daily.   MULTIPLE VITAMIN (MULTIVITAMIN) TABLET    Take 1 tablet by mouth daily.   ROSUVASTATIN (  CRESTOR) 10 MG TABLET    Take 1 tablet (10 mg total) by mouth daily.  Modified Medications   No medications on file  Discontinued Medications   TRIAMCINOLONE CREAM (KENALOG) 0.1 %    Apply 1 application topically 2 (two) times daily.    Physical Exam:  Vitals:   07/03/21 1610  BP: 140/90  Pulse: 82  Resp: 16  Temp: 99.5 F (37.5 C)  SpO2: 93%  Height: 5' 2.5" (1.588 m)   Body mass index is 25.92 kg/m. Wt Readings from Last 3 Encounters:  08/11/20 144 lb (65.3 kg)  08/11/20 145 lb (65.8 kg)  07/09/20 149 lb 9.6 oz (67.9 kg)    Physical Exam Constitutional:      General: She is not in acute distress.    Appearance: She is well-developed. She is ill-appearing. She is not diaphoretic.  HENT:     Head: Normocephalic and atraumatic.     Mouth/Throat:     Pharynx: No  oropharyngeal exudate.  Eyes:     Conjunctiva/sclera: Conjunctivae normal.     Pupils: Pupils are equal, round, and reactive to light.  Cardiovascular:     Rate and Rhythm: Normal rate and regular rhythm.     Heart sounds: Normal heart sounds.  Pulmonary:     Effort: Pulmonary effort is normal.     Breath sounds: Rhonchi (left upper lung) present.  Abdominal:     General: Bowel sounds are normal.     Palpations: Abdomen is soft.  Musculoskeletal:     Cervical back: Normal range of motion and neck supple.     Right lower leg: No edema.     Left lower leg: No edema.  Skin:    General: Skin is warm and dry.  Neurological:     Mental Status: She is alert.  Psychiatric:        Mood and Affect: Mood normal.    Labs reviewed: Basic Metabolic Panel: Recent Labs    08/08/20 0807  NA 138  K 4.3  CL 103  CO2 26  GLUCOSE 97  BUN 20  CREATININE 0.88  CALCIUM 9.1   Liver Function Tests: Recent Labs    08/08/20 0807 09/16/20 0824  AST 39* 12  ALT 36* 13  BILITOT 1.0 0.5  PROT 6.7 6.4   No results for input(s): LIPASE, AMYLASE in the last 8760 hours. No results for input(s): AMMONIA in the last 8760 hours. CBC: Recent Labs    08/08/20 0807  WBC 6.6  NEUTROABS 3,049  HGB 12.9  HCT 38.3  MCV 89.1  PLT 230   Lipid Panel: Recent Labs    08/08/20 0807  CHOL 130  HDL 40*  LDLCALC 72  TRIG 93  CHOLHDL 3.3   TSH: No results for input(s): TSH in the last 8760 hours. A1C: Lab Results  Component Value Date   HGBA1C 6.0 (H) 08/08/2020     Assessment/Plan 1. Body aches - POC Influenza A/B- neg - SARS-COV-2 RNA,(COVID-19) QUAL NAAT  2. Cough, unspecified type - POC Influenza A/B - SARS-COV-2 RNA,(COVID-19) QUAL NAAT  3. Fever, unspecified fever cause - POC Influenza A/B - SARS-COV-2 RNA,(COVID-19) QUAL NAAT  4. Bronchitis Covid test sent out due to persistent fevers, chills, body aches.  -will treat with doxycycline and azithromycin due to abnormal  lung sounds and persistent fevers as well.  -strict return/follow up precautions given.  May use tylenol 500 mg 2 tablets every 8 hours as needed aches and pains or sore throat  humidifier in the home to help with the dry air Mucinex DM by mouth twice daily as needed for cough and chest congestion with full glass of water  Keep well hydrated Proper nutrition  Avoid forcefully blowing nose Vit c 1000 mg daily Vit d 2000 units daily  Zinc 50 mg daily  - doxycycline (VIBRA-TABS) 100 MG tablet; Take 1 tablet (100 mg total) by mouth 2 (two) times daily.  Dispense: 14 tablet; Refill: 0 - azithromycin (ZITHROMAX) 250 MG tablet; Take 2 tablets on day 1, then 1 tablet daily on days 2 through 5  Dispense: 6 tablet; Refill: 0 -probiotics twice daily  Tani Virgo K. Biagio Borg  [facility] [phone]

## 2021-07-03 NOTE — Patient Instructions (Addendum)
Netipot or saline wash daily Plain nasal saline spray throughout the day as needed Avoid forcefully blowing nose May use tylenol 500 mg 2 tablets every 8 hours as needed aches and pains or sore throat can alternate  humidifier in the home to help with the dry air Mucinex DM by mouth twice daily as needed for cough and chest congestion with full glass of water  Keep well hydrated Proper nutrition  Vit c 1000 mg daily Vit d 2000 units daily  Zinc 50 mg daily   Start both antibiotics, take with food and take entire course.  Use probiotic (florastor) twice daily

## 2021-07-04 LAB — SARS-COV-2 RNA,(COVID-19) QUALITATIVE NAAT: SARS CoV2 RNA: NOT DETECTED

## 2021-07-06 ENCOUNTER — Encounter: Payer: Self-pay | Admitting: Nurse Practitioner

## 2021-07-07 LAB — POCT INFLUENZA A/B
Influenza A, POC: NEGATIVE
Influenza B, POC: NEGATIVE

## 2021-08-03 ENCOUNTER — Other Ambulatory Visit: Payer: Self-pay

## 2021-08-03 ENCOUNTER — Other Ambulatory Visit: Payer: Managed Care, Other (non HMO)

## 2021-08-03 DIAGNOSIS — R739 Hyperglycemia, unspecified: Secondary | ICD-10-CM

## 2021-08-03 DIAGNOSIS — E782 Mixed hyperlipidemia: Secondary | ICD-10-CM

## 2021-08-03 DIAGNOSIS — I129 Hypertensive chronic kidney disease with stage 1 through stage 4 chronic kidney disease, or unspecified chronic kidney disease: Secondary | ICD-10-CM

## 2021-08-04 LAB — CBC WITH DIFFERENTIAL/PLATELET
Absolute Monocytes: 550 cells/uL (ref 200–950)
Basophils Absolute: 95 cells/uL (ref 0–200)
Basophils Relative: 1.1 %
Eosinophils Absolute: 722 cells/uL — ABNORMAL HIGH (ref 15–500)
Eosinophils Relative: 8.4 %
HCT: 39.7 % (ref 35.0–45.0)
Hemoglobin: 12.9 g/dL (ref 11.7–15.5)
Lymphs Abs: 2649 cells/uL (ref 850–3900)
MCH: 29.5 pg (ref 27.0–33.0)
MCHC: 32.5 g/dL (ref 32.0–36.0)
MCV: 90.6 fL (ref 80.0–100.0)
MPV: 9.6 fL (ref 7.5–12.5)
Monocytes Relative: 6.4 %
Neutro Abs: 4584 cells/uL (ref 1500–7800)
Neutrophils Relative %: 53.3 %
Platelets: 290 10*3/uL (ref 140–400)
RBC: 4.38 10*6/uL (ref 3.80–5.10)
RDW: 12.6 % (ref 11.0–15.0)
Total Lymphocyte: 30.8 %
WBC: 8.6 10*3/uL (ref 3.8–10.8)

## 2021-08-04 LAB — COMPLETE METABOLIC PANEL WITH GFR
AG Ratio: 1.5 (calc) (ref 1.0–2.5)
ALT: 8 U/L (ref 6–29)
AST: 12 U/L (ref 10–35)
Albumin: 4.1 g/dL (ref 3.6–5.1)
Alkaline phosphatase (APISO): 134 U/L (ref 37–153)
BUN: 22 mg/dL (ref 7–25)
CO2: 30 mmol/L (ref 20–32)
Calcium: 9.3 mg/dL (ref 8.6–10.4)
Chloride: 104 mmol/L (ref 98–110)
Creat: 0.84 mg/dL (ref 0.50–1.05)
Globulin: 2.8 g/dL (calc) (ref 1.9–3.7)
Glucose, Bld: 97 mg/dL (ref 65–99)
Potassium: 3.9 mmol/L (ref 3.5–5.3)
Sodium: 139 mmol/L (ref 135–146)
Total Bilirubin: 0.7 mg/dL (ref 0.2–1.2)
Total Protein: 6.9 g/dL (ref 6.1–8.1)
eGFR: 79 mL/min/{1.73_m2} (ref >=60)

## 2021-08-04 LAB — LIPID PANEL
Cholesterol: 134 mg/dL (ref <200)
HDL: 51 mg/dL (ref >=50)
LDL Cholesterol (Calc): 67 mg/dL (calc)
Non-HDL Cholesterol (Calc): 83 mg/dL (calc) (ref <130)
Total CHOL/HDL Ratio: 2.6 (calc) (ref <5.0)
Triglycerides: 80 mg/dL (ref <150)

## 2021-08-04 LAB — HEMOGLOBIN A1C
Hgb A1c MFr Bld: 5.9 % of total Hgb — ABNORMAL HIGH (ref <5.7)
Mean Plasma Glucose: 123 mg/dL
eAG (mmol/L): 6.8 mmol/L

## 2021-08-06 ENCOUNTER — Ambulatory Visit: Payer: Managed Care, Other (non HMO) | Admitting: Orthopedic Surgery

## 2021-08-13 ENCOUNTER — Ambulatory Visit (INDEPENDENT_AMBULATORY_CARE_PROVIDER_SITE_OTHER): Payer: Managed Care, Other (non HMO) | Admitting: Adult Health

## 2021-08-13 ENCOUNTER — Encounter: Payer: Self-pay | Admitting: Orthopedic Surgery

## 2021-08-13 ENCOUNTER — Encounter: Payer: Self-pay | Admitting: Adult Health

## 2021-08-13 ENCOUNTER — Ambulatory Visit (HOSPITAL_COMMUNITY)
Admission: RE | Admit: 2021-08-13 | Discharge: 2021-08-13 | Disposition: A | Discharge status: Home / Self Care | Payer: Managed Care, Other (non HMO) | Source: Ambulatory Visit | Attending: Adult Health | Admitting: Adult Health

## 2021-08-13 ENCOUNTER — Ambulatory Visit: Payer: Managed Care, Other (non HMO) | Admitting: Orthopedic Surgery

## 2021-08-13 ENCOUNTER — Other Ambulatory Visit: Payer: Self-pay

## 2021-08-13 VITALS — BP 132/84 | HR 59 | Temp 96.6°F | Ht 64.0 in | Wt 141.0 lb

## 2021-08-13 VITALS — BP 145/70 | HR 56 | Ht 62.5 in | Wt 142.0 lb

## 2021-08-13 DIAGNOSIS — Z124 Encounter for screening for malignant neoplasm of cervix: Secondary | ICD-10-CM

## 2021-08-13 DIAGNOSIS — R0981 Nasal congestion: Secondary | ICD-10-CM

## 2021-08-13 DIAGNOSIS — I129 Hypertensive chronic kidney disease with stage 1 through stage 4 chronic kidney disease, or unspecified chronic kidney disease: Secondary | ICD-10-CM

## 2021-08-13 DIAGNOSIS — R739 Hyperglycemia, unspecified: Secondary | ICD-10-CM

## 2021-08-13 DIAGNOSIS — E782 Mixed hyperlipidemia: Secondary | ICD-10-CM

## 2021-08-13 DIAGNOSIS — Z0000 Encounter for general adult medical examination without abnormal findings: Secondary | ICD-10-CM

## 2021-08-13 DIAGNOSIS — Z1211 Encounter for screening for malignant neoplasm of colon: Secondary | ICD-10-CM | POA: Diagnosis not present

## 2021-08-13 DIAGNOSIS — F32A Depression, unspecified: Secondary | ICD-10-CM

## 2021-08-13 DIAGNOSIS — J40 Bronchitis, not specified as acute or chronic: Secondary | ICD-10-CM

## 2021-08-13 DIAGNOSIS — K121 Other forms of stomatitis: Secondary | ICD-10-CM

## 2021-08-13 DIAGNOSIS — Z1231 Encounter for screening mammogram for malignant neoplasm of breast: Secondary | ICD-10-CM | POA: Insufficient documentation

## 2021-08-13 DIAGNOSIS — Z01419 Encounter for gynecological examination (general) (routine) without abnormal findings: Secondary | ICD-10-CM

## 2021-08-13 LAB — HEMOCCULT GUIAC POC 1CARD (OFFICE): Fecal Occult Blood, POC: NEGATIVE

## 2021-08-13 NOTE — Patient Instructions (Addendum)
May try saline nasal spray for nasal congestion  If cough does not resolve try taking Zyrtec

## 2021-08-13 NOTE — Progress Notes (Signed)
Patient ID: Cassandra Pineda, female   DOB: [birth year], 61 y.o.   MRN: [id number] History of Present Illness: Cassandra Pineda is a 61 year old white female,married, PM in for a well woman gyn exam. She had physical and labs with PCP.  Lab Results  Component Value Date   DIAGPAP  08/11/2020    - Negative for intraepithelial lesion or malignancy (NILM)   HPV NOT DETECTED 08/03/2018   HPVHIGH Negative 08/11/2020    PCP is Amy Coletta Memos NP  Current Medications, Allergies, Past Medical History, Past Surgical History, Family History and Social History were reviewed in [employer] record.     Review of Systems: Patient denies any headaches, hearing loss, fatigue, blurred vision, shortness of breath, chest pain, abdominal pain, problems with bowel movements, urination, or intercourse. No joint pain or mood swings.  Denies any vaginal bleeding   Physical Exam:BP (!) 145/70 (BP Location: Right Arm, Patient Position: Sitting, Cuff Size: Normal)    Pulse (!) 56    Ht 5' 2.5" (1.588 m)    Wt 142 lb (64.4 kg)    BMI 25.56 kg/m   General:  Well developed, well nourished, no acute distress Skin:  Warm and dry Neck:  Midline trachea, normal thyroid, good ROM, no lymphadenopathy,ni carotid bruits heard Lungs; Clear to auscultation bilaterally Breast:  No dominant palpable mass, retraction, or nipple discharge Cardiovascular: Regular rate and rhythm Abdomen:  Soft, non tender, no hepatosplenomegaly Pelvic:  External genitalia is normal in appearance, no lesions.  The vagina is pale with loss of rugae. Urethra has no lesions or masses. The cervix is smooth.  Uterus is felt to be normal size, shape, and contour.  No adnexal masses or tenderness noted.Bladder is non tender, no masses felt. Rectal: Good sphincter tone, no polyps, or hemorrhoids felt.  Hemoccult negative. Extremities/musculoskeletal:  No swelling or varicosities noted, no clubbing or cyanosis Psych:  No mood changes, alert and  cooperative,seems happy AA is 0 Fall risk is low Depression screen [facility] 2/9 08/13/2021 08/13/2021 08/11/2020  Decreased Interest 0 0 0  Down, Depressed, Hopeless 0 0 0  PHQ - 2 Score 0 0 0  Altered sleeping 0 - 0  Tired, decreased energy 0 - 0  Change in appetite 0 - 0  Feeling bad or failure about yourself  0 - 0  Trouble concentrating 0 - 0  Moving slowly or fidgety/restless 0 - 0  Suicidal thoughts 0 - 0  PHQ-9 Score 0 - 0    GAD 7 : Generalized Anxiety Score 08/13/2021 08/11/2020  Nervous, Anxious, on Edge 0 0  Control/stop worrying 0 0  Worry too much - different things 0 0  Trouble relaxing 0 0  Restless 0 0  Easily annoyed or irritable 0 0  Afraid - awful might happen 0 0  Total GAD 7 Score 0 0  Examination chaperoned by Faith Rogue LPN    Impression and Plan: 1. Well woman exam with routine gynecological exam Pap in 2025 GYN physical in 1 year Mammogram today Colonoscopy per GI  Labs with PCP  2. Encounter for screening fecal occult blood testing   3. Depression, unspecified depression type Doing well on Celexa 20 mg daily, has refills

## 2021-08-13 NOTE — Progress Notes (Signed)
Careteam: Patient Care Team: Cassandra Heir, NP as PCP - General (Adult Health Nurse Practitioner)  Seen by: [name], AGNP-C  PLACE OF SERVICE:  [facility]  Advanced Directive information Does Patient Have a Medical Advance Directive?: No  Allergies  Allergen Reactions   Ace Inhibitors Cough    Chief Complaint  Patient presents with   Annual Exam    Yearly physical and discuss recent labs. Discuss the need for Springrix and COVID Booster or postpone if patient refuses.      HPI: Patient is a 61 y.o. female seen today for annual physical exam.   Labs discussed with patient.   Cough improved. She had to stop taking one of the antibiotics she was prescribed due to feeling nausea. She continues to have a cough but reports it has improved. She is taking Coricidin and believes it has helped.   Cholesterol- LDL 67, continues to take statin medication, follows low fate diet.   Hypertension- bp at goal today, continues to take losartan.  Depression/Anxiety- no mood changes, no recent panic attacks, taking about one xanax a month, remains on Celexa.   Prediabetes- A1c 5.9 01/30, follows diet low in carbs and sugars.  Mouth ulcers- no recent episodes, she found out a ingredient in regular toothpaste was cause, she switched to Sensodyne toothpaste and doing well.   Walking daily, about one hour, weather permitting.   Schedule to see eye doctor in March.   Mammogram scheduled later on today. PAP smear also today with Dr. Valentina Lucks in [place].   Shingles vaccine- not interested in getting at this time.   Covid booster- does not wish to have anymore boosters.   Almost done building house in [place]. Excited to move in.    Review of Systems:  Review of Systems  Constitutional:  Negative for chills, fever, malaise/fatigue and weight loss.  HENT:  Positive for congestion.   Eyes:  Negative for blurred vision and double vision.  Respiratory:  Positive for cough.  Negative for sputum production, shortness of breath and wheezing.   Cardiovascular:  Negative for chest pain and leg swelling.  Gastrointestinal:  Negative for abdominal pain, blood in stool, constipation, diarrhea, heartburn, nausea and vomiting.  Genitourinary:  Negative for dysuria, frequency and hematuria.  Musculoskeletal:  Negative for falls and joint pain.  Skin: Negative.   Neurological:  Negative for dizziness, weakness and headaches.  Endo/Heme/Allergies:  Positive for environmental allergies. Negative for polydipsia. Does not bruise/bleed easily.  Psychiatric/Behavioral:  Positive for depression. The patient is nervous/anxious. The patient does not have insomnia.    Past Medical History:  Diagnosis Date   Anxiety    Depression    Hyperlipidemia LDL goal < 100    Hypertension    Hypertension, renal disease    HTN related to a kidney disorder   Nephrotic syndrome    Renal disorder    Past Surgical History:  Procedure Laterality Date   RENAL BIOPSY  2013   Cassandra Pineda   TUBAL LIGATION  1993   Social History:   reports that she quit smoking about 39 years ago. Her smoking use included cigarettes. She has a 45.00 pack-year smoking history. She has never used smokeless tobacco. She reports that she does not drink alcohol and does not use drugs.  Family History  Problem Relation Age of Onset   Heart disease Mother 64   Parkinson's disease Father    Asthma Sister    Hypertension Sister    Lymphoma  Sister 81   Hypertension Brother    Heart disease Brother 51       heart attack   Hypertension Sister    Hypertension Brother    Colon cancer Neg Hx     Medications: Patient's Medications  New Prescriptions   No medications on file  Previous Medications   ACETAMINOPHEN (TYLENOL) 325 MG TABLET    Take 650 mg by mouth every 6 (six) hours as needed.   ALBUTEROL (PROAIR HFA) 108 (90 BASE) MCG/ACT INHALER    INHALE 2 PUFFS INTO THE LUNGS EVERY SIX HOURS AS NEEDED FOR  WHEEZING OR SHORTNESS OF BREATH   ALPRAZOLAM (XANAX) 0.5 MG TABLET    TAKE ONE TABLET BY MOUTH EVERY 8 HOURS AS NEEDED FOR ANXIETY   CITALOPRAM (CELEXA) 20 MG TABLET    TAKE ONE TABLET BY MOUTH DAILY   COLCHICINE 0.6 MG TABLET    Take 2 tablets (1.2 mg total) by mouth daily.   DOXYCYCLINE (VIBRA-TABS) 100 MG TABLET    Take 1 tablet (100 mg total) by mouth 2 (two) times daily.   LOSARTAN (COZAAR) 100 MG TABLET    Take 0.5 tablets (50 mg total) by mouth daily.   MULTIPLE VITAMIN (MULTIVITAMIN) TABLET    Take 1 tablet by mouth daily.   ROSUVASTATIN (CRESTOR) 10 MG TABLET    Take 1 tablet (10 mg total) by mouth daily.  Modified Medications   No medications on file  Discontinued Medications   No medications on file    Physical Exam:  There were no vitals filed for this visit. There is no height or weight on file to calculate BMI. Wt Readings from Last 3 Encounters:  08/11/20 144 lb (65.3 kg)  08/11/20 145 lb (65.8 kg)  07/09/20 149 lb 9.6 oz (67.9 kg)    Physical Exam Vitals reviewed.  Constitutional:      General: She is not in acute distress. HENT:     Head: Normocephalic.     Right Ear: There is no impacted cerumen.     Left Ear: There is no impacted cerumen.     Nose: Nose normal.     Right Turbinates: Enlarged.     Left Turbinates: Enlarged.     Mouth/Throat:     Mouth: Mucous membranes are moist.  Eyes:     General:        Right eye: No discharge.        Left eye: No discharge.  Neck:     Thyroid: No thyroid mass or thyromegaly.     Vascular: No carotid bruit.  Cardiovascular:     Rate and Rhythm: Normal rate and regular rhythm.     Pulses: Normal pulses.     Heart sounds: Normal heart sounds. No murmur heard. Pulmonary:     Effort: Pulmonary effort is normal. No respiratory distress.     Breath sounds: Normal breath sounds. No wheezing.  Abdominal:     General: Bowel sounds are normal. There is no distension.     Palpations: Abdomen is soft.     Tenderness:  There is no abdominal tenderness.  Musculoskeletal:     Cervical back: Neck supple.     Right lower leg: No edema.     Left lower leg: No edema.  Lymphadenopathy:     Cervical: No cervical adenopathy.  Skin:    General: Skin is warm and dry.     Capillary Refill: Capillary refill takes less than 2 seconds.  Neurological:  General: No focal deficit present.     Mental Status: She is alert and oriented to person, place, and time.     Sensory: No sensory deficit.     Motor: No weakness.     Coordination: Coordination normal.     Gait: Gait normal.  Psychiatric:        Mood and Affect: Mood normal.        Behavior: Behavior normal.    Labs reviewed: Basic Metabolic Panel: Recent Labs    08/03/21 0823  NA 139  K 3.9  CL 104  CO2 30  GLUCOSE 97  BUN 22  CREATININE 0.84  CALCIUM 9.3   Liver Function Tests: Recent Labs    09/16/20 0824 08/03/21 0823  AST 12 12  ALT 13 8  BILITOT 0.5 0.7  PROT 6.4 6.9   No results for input(s): LIPASE, AMYLASE in the last 8760 hours. No results for input(s): AMMONIA in the last 8760 hours. CBC: Recent Labs    08/03/21 0823  WBC 8.6  NEUTROABS 4,584  HGB 12.9  HCT 39.7  MCV 90.6  PLT 290   Lipid Panel: Recent Labs    08/03/21 0823  CHOL 134  HDL 51  LDLCALC 67  TRIG 80  CHOLHDL 2.6   TSH: No results for input(s): TSH in the last 8760 hours. A1C: Lab Results  Component Value Date   HGBA1C 5.9 (H) 08/03/2021     Assessment/Plan 1. Annual physical exam - BMI 24.20 - BP controlled - EKG 2021- sinus brady, HR 59 today - paperwork completed for work  2. Bronchitis - improved - lung sounds clear today  3. Nasal congestion - turbinates enlarged - recommend saline nasal spray and antihistamine  4. Hypertension, renal disease, stage 1-4 or unspecified chronic kidney disease - controlled - BUN/creat 22/0.84, GFR 79 08/03/2021 - cont losartan  5. Mixed hyperlipidemia - LDL 67 08/03/2021 - cont  statin  6. Hyperglycemia - A1c 5.9 08/03/2021 - cont to limit sugars and carbs in diet  7. Mouth ulcers - no recent episodes - improved with Sensodyne toothpaste  Total time: 35 minutes. Greater than 50% of total time spent doing patient education on health maintenance, symptom management for cold symptoms, hyperglycemia and medications.     Next appt: Visit date not found  [place], [facility] [phone]

## 2021-12-09 ENCOUNTER — Other Ambulatory Visit: Payer: Self-pay | Admitting: Orthopedic Surgery

## 2021-12-09 DIAGNOSIS — E782 Mixed hyperlipidemia: Secondary | ICD-10-CM

## 2021-12-09 NOTE — Telephone Encounter (Signed)
Cholesterol was at goal 07/2021. Can we call patient and ask if she is currently taking medication? If she is not, I think we should repeat lipid panel before restarting medication.

## 2021-12-09 NOTE — Telephone Encounter (Signed)
Patient has request refill on medication Crestor 10mg . Patient medication last refilled 11/18/2020. Patient medication has High Risk Warnings. Medication pend and sent to PCP 11/20/2020, NP for approval. Please Advise.

## 2021-12-10 NOTE — Telephone Encounter (Signed)
Patient called the office back. Patient states that she's still taking Crestor 10mg  and that she's been taking this medication. Patient states that it was being filled by OBGYN at some point. She states she wants medication to be filled by [facility] now. Medication pend and sent back to PCP [facility], NP

## 2021-12-10 NOTE — Telephone Encounter (Signed)
Called patient and no answer. Voicemail was left with office call back number.   

## 2022-01-02 ENCOUNTER — Telehealth: Payer: Managed Care, Other (non HMO) | Admitting: Physician Assistant

## 2022-01-02 DIAGNOSIS — K121 Other forms of stomatitis: Secondary | ICD-10-CM | POA: Diagnosis not present

## 2022-01-02 MED ORDER — TRIAMCINOLONE ACETONIDE 0.1 % MT PSTE: PASTE | OROMUCOSAL | 0 refills | Status: DC

## 2022-01-02 NOTE — Progress Notes (Signed)
E-Visit for Mouth Ulcers  We are sorry that you are not feeling well.  Here is how we plan to help!  Based on what you have shared with me, it appears that you do have mouth ulcer(s).     The following medications should decrease the discomfort and help with healing.  Triamcinolone Dental Paste apply 3 times daily as needed for up to 3 days   Mouth ulcers are painful areas in the mouth and gums. These are also known as "canker sores".  They can occur anywhere inside the mouth. While mostly harmless, mouth ulcers can be extremely uncomfortable and may make it difficult to eat, drink, and brush your teeth.  You may have more than 1 ulcer and they can vary and change in size. Mouth ulcers are not contagious and should not be confused with cold sores.  Cold sores appear on the lip or around the outside of the mouth and often begin with a tingling, burning or itching sensation.   While the exact causes are unknown, some common causes and factors that may aggravate mouth ulcers include: Genetics - Sometimes mouth ulcers run in families High alcohol intake Acidic foods such as citrus fruits like pineapple, grapefruit, orange fruits/juices, may aggravate mouth ulcers Other foods high in acidity or spice such as coffee, chocolate, chips, pretzels, eggs, nuts, cheese Quitting smoking Injury caused by biting the tongue or inside of the cheek Diet lacking in B-12, zinc, folic acid or iron Female hormone shifts with menstruation Excessive fatigue, emotional stress or anxiety Prevention: Talk to your doctor if you are taking meds that are known to cause mouth ulcers such as:   Anti-inflammatory drugs (for example Ibuprofen, Naproxen sodium), pain killers, Beta blockers, Oral nicotine replacement drugs, Some street drugs (heroin).   Avoid allowing any tablets to dissolve in your mouth that are meant to swallowed whole Avoid foods/drinks that trigger or worsen symptoms Keep your mouth clean with daily  brushing and flossing  Home Care: The goal with treatment is to ease the pain where ulcers occur and help them heal as quickly as possible.  There is no medical treatment to prevent mouth ulcers from coming back or recurring.  Avoid spicy and acidic foods Eat soft foods and avoid rough, crunchy foods Avoid chewing gum Do not use toothpaste that contains sodium lauryl sulphite Use a straw to drink which helps avoid liquids toughing the ulcers near the front of your mouth Use a very soft toothbrush If you have dentures or dental hardware that you feel is not fitting well or contributing to his, please see your dentist. Use saltwater mouthwash which helps healing. Dissolve a  teaspoon of salt in a glass of warm water. Swish around your mouth and spit it out. This can be used as needed if it is soothing.   GET HELP RIGHT AWAY IF: Persistent ulcers require checking IN PERSON (face to face). Any mouth lesion lasting longer than a month should be seen by your DENTIST as soon as possible for evaluation for possible oral cancer. If you have a non-painful ulcer in 1 or more areas of your mouth Ulcers that are spreading, are very large or particularly painful Ulcers last longer than one week without improving on treatment If you develop a fever, swollen glands and begin to feel unwell Ulcers that developed after starting a new medication  MAKE SURE YOU: Understand these instructions. Will watch your condition. Will get help right away if you are not doing well or  get worse.  Thank you for choosing an e-visit.  Your e-visit answers were reviewed by a board certified advanced clinical practitioner to complete your personal care plan. Depending upon the condition, your plan could have included both over the counter or prescription medications.  Please review your pharmacy choice. Make sure the pharmacy is open so you can pick up prescription now. If there is a problem, you may contact your provider  through [employer] and have the prescription routed to another pharmacy.  Your safety is important to [name]. If you have drug allergies check your prescription carefully.   For the next 24 hours you can use MyChart to ask questions about today's visit, request a non-urgent call back, or ask for a work or school excuse.  You will get an email in the next two days asking about your experience. I hope that your e-visit has been valuable and will speed your recovery.  This appointment required 5-10 minutes of patient care (this includes precharting, chart review, review of results, face-to-face care, etc.).  Jarold Motto PA-C

## 2022-03-03 ENCOUNTER — Other Ambulatory Visit (HOSPITAL_COMMUNITY): Payer: Self-pay | Admitting: Adult Health

## 2022-03-03 DIAGNOSIS — Z1231 Encounter for screening mammogram for malignant neoplasm of breast: Secondary | ICD-10-CM

## 2022-03-08 ENCOUNTER — Other Ambulatory Visit: Payer: Self-pay | Admitting: Orthopedic Surgery

## 2022-03-08 DIAGNOSIS — K121 Other forms of stomatitis: Secondary | ICD-10-CM

## 2022-04-13 ENCOUNTER — Other Ambulatory Visit: Payer: Self-pay | Admitting: Adult Health

## 2022-04-13 ENCOUNTER — Ambulatory Visit: Payer: Managed Care, Other (non HMO) | Admitting: Adult Health

## 2022-07-14 ENCOUNTER — Other Ambulatory Visit: Payer: Self-pay | Admitting: Orthopedic Surgery

## 2022-07-20 ENCOUNTER — Other Ambulatory Visit: Payer: Self-pay | Admitting: Orthopedic Surgery

## 2022-07-20 DIAGNOSIS — I129 Hypertensive chronic kidney disease with stage 1 through stage 4 chronic kidney disease, or unspecified chronic kidney disease: Secondary | ICD-10-CM

## 2022-07-20 DIAGNOSIS — E782 Mixed hyperlipidemia: Secondary | ICD-10-CM

## 2022-07-20 DIAGNOSIS — R739 Hyperglycemia, unspecified: Secondary | ICD-10-CM

## 2022-08-16 ENCOUNTER — Other Ambulatory Visit: Payer: Managed Care, Other (non HMO)

## 2022-08-16 DIAGNOSIS — I129 Hypertensive chronic kidney disease with stage 1 through stage 4 chronic kidney disease, or unspecified chronic kidney disease: Secondary | ICD-10-CM

## 2022-08-16 DIAGNOSIS — E782 Mixed hyperlipidemia: Secondary | ICD-10-CM

## 2022-08-16 DIAGNOSIS — R739 Hyperglycemia, unspecified: Secondary | ICD-10-CM

## 2022-08-19 ENCOUNTER — Other Ambulatory Visit: Payer: Managed Care, Other (non HMO)

## 2022-08-19 ENCOUNTER — Ambulatory Visit: Payer: Managed Care, Other (non HMO) | Admitting: Family

## 2022-08-19 ENCOUNTER — Ambulatory Visit: Payer: Managed Care, Other (non HMO) | Admitting: Orthopedic Surgery

## 2022-08-19 ENCOUNTER — Ambulatory Visit (HOSPITAL_COMMUNITY): Payer: Managed Care, Other (non HMO)

## 2022-08-19 ENCOUNTER — Ambulatory Visit: Payer: Managed Care, Other (non HMO) | Admitting: Adult Health

## 2022-08-19 ENCOUNTER — Encounter: Payer: Self-pay | Admitting: Family

## 2022-08-19 ENCOUNTER — Encounter: Payer: Self-pay | Admitting: Adult Health

## 2022-08-19 VITALS — BP 141/74 | HR 69 | Ht 62.0 in | Wt 145.0 lb

## 2022-08-19 VITALS — BP 126/80 | HR 65 | Temp 97.7°F | Resp 16 | Ht 62.5 in | Wt 143.2 lb

## 2022-08-19 DIAGNOSIS — Z1211 Encounter for screening for malignant neoplasm of colon: Secondary | ICD-10-CM

## 2022-08-19 DIAGNOSIS — R350 Frequency of micturition: Secondary | ICD-10-CM | POA: Diagnosis not present

## 2022-08-19 DIAGNOSIS — E782 Mixed hyperlipidemia: Secondary | ICD-10-CM | POA: Diagnosis not present

## 2022-08-19 DIAGNOSIS — I10 Essential (primary) hypertension: Secondary | ICD-10-CM

## 2022-08-19 DIAGNOSIS — R051 Acute cough: Secondary | ICD-10-CM | POA: Diagnosis not present

## 2022-08-19 DIAGNOSIS — Z01419 Encounter for gynecological examination (general) (routine) without abnormal findings: Secondary | ICD-10-CM | POA: Diagnosis not present

## 2022-08-19 LAB — POCT URINALYSIS DIPSTICK
Glucose, UA: NEGATIVE
Ketones, UA: NEGATIVE
Nitrite, UA: NEGATIVE
Protein, UA: NEGATIVE

## 2022-08-19 LAB — HEMOCCULT GUIAC POC 1CARD (OFFICE): Fecal Occult Blood, POC: NEGATIVE

## 2022-08-19 MED ORDER — DOXYCYCLINE HYCLATE 100 MG PO TABS: 100.0000 mg | ORAL_TABLET | Freq: Two times a day (BID) | ORAL | 0 refills | Status: AC

## 2022-08-19 NOTE — Progress Notes (Signed)
Patient ID: Cassandra Pineda, female   DOB: Mar 09, 1961, 62 y.o.   MRN: [id number] History of Present Illness: Cassandra Pineda is a 62 year old white female,married, PM in for a well woman gyn exam. Has urinary frequency. She has had cough and saw PCP today.  Last pap was negative HPV, NILM 08/11/20  PCP is Cassandra Moulding NP  Current Medications, Allergies, Past Medical History, Past Surgical History, Family History and Social History were reviewed in [employer] record.     Review of Systems:  Patient denies any headaches, hearing loss, fatigue, blurred vision, shortness of breath, chest pain, abdominal pain, problems with bowel movements,  or intercourse. No joint pain or mood swings.  See HPI for positives. Denies any vaginal bleeding  Physical Exam:BP (!) 141/74 (BP Location: Right Arm, Patient Position: Sitting, Cuff Size: Normal)   Pulse 69   Ht 5' 2"$  (1.575 m)   Wt 145 lb (65.8 kg)   BMI 26.52 kg/m  Urine dipstick trace blood and trace leuks  General:  Well developed, well nourished, no acute distress Skin:  Warm and dry Neck:  Midline trachea, normal thyroid, good ROM, no lymphadenopathy,no carotid bruits heard  Lungs; Clear to auscultation bilaterally Breast:  No dominant palpable mass, retraction, or nipple discharge Cardiovascular: Regular rate and rhythm Abdomen:  Soft, non tender, no hepatosplenomegaly Pelvic:  External genitalia is normal in appearance, no lesions.  The vagina is pale. Urethra has small caruncle.The cervix is smooth.  Uterus is felt to be normal size, shape, and contour.  No adnexal masses or tenderness noted.Bladder is non tender, no masses felt. Rectal: Good sphincter tone, no polyps, or hemorrhoids felt.  Hemoccult negative. Extremities/musculoskeletal:  No swelling or varicosities noted, no clubbing or cyanosis Psych:  No mood changes, alert and cooperative,seems happy AA is 0 Fall risk is low    08/19/2022   11:36 AM 08/13/2021   10:41 AM  08/13/2021    8:34 AM  Depression screen PHQ 2/9  Decreased Interest 0 0 0  Down, Depressed, Hopeless 0 0 0  PHQ - 2 Score 0 0 0  Altered sleeping 0 0   Tired, decreased energy 1 0   Change in appetite 0 0   Feeling bad or failure about yourself  0 0   Trouble concentrating 0 0   Moving slowly or fidgety/restless 0 0   Suicidal thoughts 0 0   PHQ-9 Score 1 0        08/19/2022   11:36 AM 08/13/2021   10:41 AM 08/11/2020    1:33 PM  GAD 7 : Generalized Anxiety Score  Nervous, Anxious, on Edge 0 0 0  Control/stop worrying 0 0 0  Worry too much - different things 0 0 0  Trouble relaxing 0 0 0  Restless 0 0 0  Easily annoyed or irritable 0 0 0  Afraid - awful might happen 0 0 0  Total GAD 7 Score 0 0 0      Upstream - 08/19/22 1145       Pregnancy Intention Screening   Does the patient want to become pregnant in the next year? N/A    Does the patient's partner want to become pregnant in the next year? N/A    Would the patient like to discuss contraceptive options today? N/A      Contraception Wrap Up   Current Method Female Sterilization    End Method Female Sterilization    Contraception Counseling Provided No  Examination chaperoned by Levy Pupa LPN  Impression and Plan: 1. Urinary frequency UA C&S sent to rule out UTI  2. Encounter for well woman exam with routine gynecological exam Pap and physical in 1 year Mammogram in am Labs with PCP Colonoscopy per GI  Continue celexa 20 mg daily has refills  Stays active  3. Encounter for screening fecal occult blood testing Hemoccult was negative

## 2022-08-19 NOTE — Progress Notes (Signed)
Provider: Aquila Delaughter FNP-C   Yvonna Alanis, NP  Patient Care Team: Yvonna Alanis, NP as PCP - General (Adult Health Nurse Practitioner)  Extended Emergency Contact Information Primary Emergency Contact: Dufrane,Don Address: [place], [place] Phone: [phone] Work Phone: [phone] Relation: Spouse  Code Status:  Full Code  Goals of care: Advanced Directive information    08/19/2022    8:36 AM  Advanced Directives  Does Patient Have a Medical Advance Directive? No  Would patient like information on creating a medical advance directive? No - Patient declined     Chief Complaint  Patient presents with   Medical Management of Chronic Issues    Routine Visit.    Immunizations    Discuss the need for Shingrix vaccine. NCIR VERIFIED    HPI:  Pt is a 62 y.o. female seen today for medical management of chronic diseases.    Hypertension - she does not check her B/p at home.she denies any headache,dizziness,vision changes,fatigue,chest tightness,palpitation,chest pain or shortness of breath.     Depression /Anxiety - states symptoms stable on Celexa.Has tried GDR in the past but made her feel worse.  Hyperlipidemia - had lab work drawn recent but result not yet out.she does not exercise. 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  Complains of cough x 4 days .Had fever on Sunday.Tested negative for COVID-19 on Sunday.Has had runny nose .sore throat has resolved.She denies any fever,chills,fatigue,body aches,runny nose,chest tightness,chest pain,palpitation or shortness of breath.Has used mucinex -DM.   Immunization up to date except shingles vaccine.aware to get vaccine at the pharmacy. Past Medical History:  Diagnosis Date   Anxiety    Depression    Hyperlipidemia LDL goal < 100    Hypertension    Hypertension, renal disease    HTN related to a kidney disorder   Nephrotic syndrome    Renal  disorder    Past Surgical History:  Procedure Laterality Date   RENAL BIOPSY  2013   [place]    Allergies  Allergen Reactions   Ace Inhibitors Cough    Allergies as of 08/19/2022       Reactions   Ace Inhibitors Cough        Medication List        Accurate as of August 19, 2022  8:48 AM. If you have any questions, ask your nurse or doctor.          STOP taking these medications    triamcinolone 0.1 % paste Commonly known as: KENALOG Stopped by: Sandrea Hughs, NP       TAKE these medications    acetaminophen 325 MG tablet Commonly known as: TYLENOL Take 650 mg by mouth every 6 (six) hours as needed.   albuterol 108 (90 Base) MCG/ACT inhaler Commonly known as: ProAir HFA INHALE 2 PUFFS INTO THE LUNGS EVERY SIX HOURS AS NEEDED FOR WHEEZING OR SHORTNESS OF BREATH   ALPRAZolam 0.5 MG tablet Commonly known as: XANAX TAKE ONE TABLET BY MOUTH EVERY 8 HOURS AS NEEDED FOR ANXIETY   citalopram 20 MG tablet Commonly known as: CELEXA TAKE ONE TABLET BY MOUTH DAILY   colchicine 0.6 MG tablet TAKE TWO TABLETS BY MOUTH DAILY   losartan 100 MG tablet Commonly known as: COZAAR TAKE 1/2 TABLET BY MOUTH DAILY   multivitamin tablet Take 1 tablet  by mouth daily.   rosuvastatin 10 MG tablet Commonly known as: CRESTOR TAKE 1 TABLET BY MOUTH DAILY        Review of Systems  Constitutional:  Negative for appetite change, chills, fatigue, fever and unexpected weight change.  HENT:  Positive for rhinorrhea. Negative for congestion, dental problem, ear discharge, ear pain, facial swelling, hearing loss, nosebleeds, postnasal drip, sinus pressure, sinus pain, sneezing, sore throat, tinnitus and trouble swallowing.   Eyes:  Positive for visual disturbance. Negative for pain, discharge, redness and itching.       Wears year follows up ophthalmology last year.   Respiratory:  Positive for cough. Negative for chest tightness,  shortness of breath and wheezing.   Cardiovascular:  Negative for chest pain, palpitations and leg swelling.  Gastrointestinal:  Negative for abdominal distention, abdominal pain, blood in stool, constipation, diarrhea, nausea and vomiting.  Endocrine: Negative for cold intolerance, heat intolerance, polydipsia, polyphagia and polyuria.  Genitourinary:  Negative for difficulty urinating, dysuria, flank pain, frequency and urgency.  Musculoskeletal:  Negative for arthralgias, back pain, gait problem, joint swelling, myalgias, neck pain and neck stiffness.  Skin:  Negative for color change, pallor, rash and wound.  Neurological:  Negative for dizziness, syncope, speech difficulty, weakness, light-headedness, numbness and headaches.  Hematological:  Does not bruise/bleed easily.  Psychiatric/Behavioral:  Negative for agitation, behavioral problems, confusion, hallucinations, self-injury, sleep disturbance and suicidal ideas. The patient is not nervous/anxious.     Immunization History  Administered Date(s) Administered   Influenza-Unspecified 05/05/2018   PFIZER Comirnaty(Gray Top)Covid-19 Tri-Sucrose Vaccine 04/03/2020, 05/04/2020   Tdap 06/18/2013   Pertinent  Health Maintenance Due  Topic Date Due   PAP SMEAR-Modifier  08/12/2023   MAMMOGRAM  08/14/2023   COLONOSCOPY (Pts 45-71yr Insurance coverage will need to be confirmed)  08/25/2023   INFLUENZA VACCINE  Discontinued      08/11/2020    1:35 PM 07/03/2021    3:54 PM 08/13/2021    8:33 AM 08/13/2021   10:41 AM 08/19/2022    8:36 AM  Fall Risk  Falls in the past year? 0 0 0 0 0  Was there an injury with Fall?  0 0 0 0  Fall Risk Category Calculator  0 0 0 0  Fall Risk Category (Retired)  Low Low Low   (RETIRED) Patient Fall Risk Level Low fall risk Low fall risk Low fall risk Low fall risk   Patient at Risk for Falls Due to  No Fall Risks No Fall Risks No Fall Risks No Fall Risks  Fall risk Follow up  Falls evaluation completed Falls  evaluation completed Falls evaluation completed Falls evaluation completed;Education provided;Falls prevention discussed   Functional Status Survey:    Vitals:   08/19/22 0831  BP: 126/80  Pulse: 65  Resp: 16  Temp: 97.7 F (36.5 C)  SpO2: 95%  Weight: 143 lb 3.2 oz (65 kg)  Height: 5' 2.5" (1.588 m)   Body mass index is 25.77 kg/m. Physical Exam Vitals reviewed.  Constitutional:      General: She is not in acute distress.    Appearance: Normal appearance. She is normal weight. She is not ill-appearing or diaphoretic.  HENT:     Head: Normocephalic.     Right Ear: Tympanic membrane, ear canal and external ear normal. There is no impacted cerumen.     Left Ear: Tympanic membrane, ear canal and external ear normal. There is no impacted cerumen.     Nose: Congestion and rhinorrhea present.  Mouth/Throat:     Mouth: Mucous membranes are moist.     Pharynx: Oropharynx is clear. No oropharyngeal exudate or posterior oropharyngeal erythema.     Comments: Right lower lip ulcer Eyes:     General: No scleral icterus.       Right eye: No discharge.        Left eye: No discharge.     Extraocular Movements: Extraocular movements intact.     Conjunctiva/sclera: Conjunctivae normal.     Pupils: Pupils are equal, round, and reactive to light.  Neck:     Vascular: No carotid bruit.  Cardiovascular:     Rate and Rhythm: Normal rate and regular rhythm.     Pulses: Normal pulses.     Heart sounds: Normal heart sounds. No murmur heard.    No friction rub. No gallop.  Pulmonary:     Effort: Pulmonary effort is normal. No respiratory distress.     Breath sounds: Normal breath sounds. No wheezing, rhonchi or rales.  Chest:     Chest wall: No tenderness.  Abdominal:     General: Bowel sounds are normal. There is no distension.     Palpations: Abdomen is soft. There is no mass.     Tenderness: There is no abdominal tenderness. There is no right CVA tenderness, left CVA tenderness,  guarding or rebound.  Musculoskeletal:        General: No swelling or tenderness. Normal range of motion.     Cervical back: Normal range of motion. No rigidity or tenderness.     Right lower leg: No edema.     Left lower leg: No edema.  Lymphadenopathy:     Cervical: No cervical adenopathy.  Skin:    General: Skin is warm and dry.     Coloration: Skin is not pale.     Findings: No bruising, erythema, lesion or rash.  Neurological:     Mental Status: She is alert and oriented to person, place, and time.     Cranial Nerves: No cranial nerve deficit.     Sensory: No sensory deficit.     Motor: No weakness.     Coordination: Coordination normal.     Gait: Gait normal.  Psychiatric:        Mood and Affect: Mood normal.        Speech: Speech normal.        Behavior: Behavior normal.        Thought Content: Thought content normal.        Judgment: Judgment normal.    Labs reviewed: No results for input(s): "NA", "K", "CL", "CO2", "GLUCOSE", "BUN", "CREATININE", "CALCIUM", "MG", "PHOS" in the last 8760 hours. No results for input(s): "AST", "ALT", "ALKPHOS", "BILITOT", "PROT", "ALBUMIN" in the last 8760 hours. No results for input(s): "WBC", "NEUTROABS", "HGB", "HCT", "MCV", "PLT" in the last 8760 hours. No results found for: "TSH" Lab Results  Component Value Date   HGBA1C 5.9 (H) 08/03/2021   Lab Results  Component Value Date   CHOL 134 08/03/2021   HDL 51 08/03/2021   LDLCALC 67 08/03/2021   TRIG 80 08/03/2021   CHOLHDL 2.6 08/03/2021    Significant Diagnostic Results in last 30 days:  No results found.  Assessment/Plan 1. Acute cough Afebrile  Home COVID-19 test negative  Start on Doxycyline as below.side effects discussed  - doxycycline (VIBRA-TABS) 100 MG tablet; Take 1 tablet (100 mg total) by mouth 2 (two) times daily for 7 days.  Dispense: 14 tablet; Refill:  0  2. Mixed hyperlipidemia LDL at goal - continue on Rosuvastatin  - dietary modification and  exercise    3. Primary hypertension B/p well controlled  - continue on losartan  - on statin for cardiovascular event prevention.consider adding ASA  Family/ staff Communication: Reviewed plan of care with patient verbalized understanding   Labs/tests ordered: None   Next Appointment : Return in about 1 year (around 08/20/2023) for annual Physical examination.Recommended 6 months F/U but states her insurance pays for once a year.    Sandrea Hughs, NP

## 2022-08-20 ENCOUNTER — Ambulatory Visit (HOSPITAL_COMMUNITY)
Admission: RE | Admit: 2022-08-20 | Discharge: 2022-08-20 | Disposition: A | Discharge status: Home / Self Care | Payer: Managed Care, Other (non HMO) | Source: Ambulatory Visit | Attending: Adult Health | Admitting: Adult Health

## 2022-08-20 ENCOUNTER — Encounter (HOSPITAL_COMMUNITY): Payer: Self-pay

## 2022-08-20 DIAGNOSIS — Z1231 Encounter for screening mammogram for malignant neoplasm of breast: Secondary | ICD-10-CM | POA: Diagnosis present

## 2022-08-20 LAB — URINALYSIS, ROUTINE W REFLEX MICROSCOPIC
Bilirubin, UA: NEGATIVE
Glucose, UA: NEGATIVE
Ketones, UA: NEGATIVE
Leukocytes,UA: NEGATIVE
Nitrite, UA: NEGATIVE
Protein,UA: NEGATIVE
RBC, UA: NEGATIVE
Specific Gravity, UA: 1.028 (ref 1.005–1.030)
Urobilinogen, Ur: 0.2 mg/dL (ref 0.2–1.0)
pH, UA: 5.5 (ref 5.0–7.5)

## 2022-08-21 LAB — CBC WITH DIFFERENTIAL/PLATELET
Absolute Monocytes: 1100 cells/uL — ABNORMAL HIGH (ref 200–950)
Basophils Absolute: 47 cells/uL (ref 0–200)
Basophils Relative: 0.5 %
Eosinophils Absolute: 790 cells/uL — ABNORMAL HIGH (ref 15–500)
Eosinophils Relative: 8.4 %
HCT: 38.2 % (ref 35.0–45.0)
Hemoglobin: 12.9 g/dL (ref 11.7–15.5)
Lymphs Abs: 1222 cells/uL (ref 850–3900)
MCH: 30.1 pg (ref 27.0–33.0)
MCHC: 33.8 g/dL (ref 32.0–36.0)
MCV: 89 fL (ref 80.0–100.0)
MPV: 9.7 fL (ref 7.5–12.5)
Monocytes Relative: 11.7 %
Neutro Abs: 6242 cells/uL (ref 1500–7800)
Neutrophils Relative %: 66.4 %
Platelets: 234 10*3/uL (ref 140–400)
RBC: 4.29 10*6/uL (ref 3.80–5.10)
RDW: 12.3 % (ref 11.0–15.0)
Total Lymphocyte: 13 %
WBC: 9.4 10*3/uL (ref 3.8–10.8)

## 2022-08-21 LAB — BASIC METABOLIC PANEL WITH GFR
BUN: 18 mg/dL (ref 7–25)
CO2: 24 mmol/L (ref 20–32)
Calcium: 8.9 mg/dL (ref 8.6–10.4)
Chloride: 104 mmol/L (ref 98–110)
Creat: 0.94 mg/dL (ref 0.50–1.05)
Glucose, Bld: 107 mg/dL — ABNORMAL HIGH (ref 65–99)
Potassium: 4 mmol/L (ref 3.5–5.3)
Sodium: 139 mmol/L (ref 135–146)
eGFR: 68 mL/min/{1.73_m2} (ref >=60)

## 2022-08-21 LAB — LIPID PANEL
Cholesterol: 136 mg/dL (ref <200)
HDL: 47 mg/dL — ABNORMAL LOW (ref >=50)
LDL Cholesterol (Calc): 71 mg/dL (calc)
Non-HDL Cholesterol (Calc): 89 mg/dL (calc) (ref <130)
Total CHOL/HDL Ratio: 2.9 (calc) (ref <5.0)
Triglycerides: 93 mg/dL (ref <150)

## 2022-08-21 LAB — HEMOGLOBIN A1C
Hgb A1c MFr Bld: 6 % of total Hgb — ABNORMAL HIGH (ref <5.7)
Mean Plasma Glucose: 126 mg/dL
eAG (mmol/L): 7 mmol/L

## 2022-08-23 LAB — URINE CULTURE

## 2023-02-17 ENCOUNTER — Ambulatory Visit: Payer: Managed Care, Other (non HMO) | Admitting: Orthopedic Surgery

## 2023-02-17 VITALS — BP 160/78 | HR 56 | Temp 97.6°F | Resp 16 | Ht 62.0 in | Wt 151.0 lb

## 2023-02-17 DIAGNOSIS — B372 Candidiasis of skin and nail: Secondary | ICD-10-CM

## 2023-02-17 MED ORDER — FLUCONAZOLE 150 MG PO TABS: 150.0000 mg | ORAL_TABLET | Freq: Once | ORAL | 0 refills | Status: AC

## 2023-02-17 MED ORDER — NYSTATIN 100000 UNIT/GM EX CREA
1.0000 | TOPICAL_CREAM | Freq: Three times a day (TID) | CUTANEOUS | 5 refills | Status: DC
Start: 2023-02-17 — End: 2023-08-25

## 2023-02-17 NOTE — Progress Notes (Signed)
Careteam: Patient Care Team: Octavia Heir, NP as PCP - General (Adult Health Nurse Practitioner)  Seen by: [name], AGNP-C  PLACE OF SERVICE:  [facility]  Advanced Directive information Does Patient Have a Medical Advance Directive?: No, Would patient like information on creating a medical advance directive?: No - Patient declined  Allergies  Allergen Reactions   Ace Inhibitors Cough    Chief Complaint  Patient presents with   Acute Visit    Patient complains of rash in bra area that's been present for a couple of days. Symptoms are itchy, red, odor, and drainage. Grandson had impetigo.     HPI: Patient is a 62 y.o. female seen today for acute visit due to rash.   08/10 she noticed rash develop between breasts. She admits to doing yard work with flowers this past weekend. 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 No contact with poison ivy/oak/sumac. Her grandsons recently had impetigo. She admits rash is very itchy. Denies insect bite or blister. She has not changes detergents or soaps. She has tried to cover it with bandages to stop scratching. She had not tried any other OTC interventions.   Review of Systems:  Review of Systems  Constitutional: Negative.   HENT: Negative.    Respiratory:  Negative for cough, shortness of breath and wheezing.   Cardiovascular:  Negative for chest pain and leg swelling.  Skin:  Positive for itching and rash.  Psychiatric/Behavioral:  Negative for depression. The patient is not nervous/anxious.     Past Medical History:  Diagnosis Date   Anxiety    Depression    Hyperlipidemia LDL goal < 100    Hypertension    Hypertension, renal disease    HTN related to a kidney disorder   Nephrotic syndrome    Renal disorder    Past Surgical History:  Procedure Laterality Date   RENAL BIOPSY  2013   [name]   TUBAL LIGATION  1993   Social History:   reports that she quit smoking about 40 years ago. Her  smoking use included cigarettes. She has never used smokeless tobacco. She reports that she does not drink alcohol and does not use drugs.  Family History  Problem Relation Age of Onset   Heart disease Mother 21   Parkinson's disease Father    Asthma Sister    Hypertension Sister    Lymphoma Sister 73   Hypertension Brother    Heart disease Brother 45       heart attack   Hypertension Sister    Hypertension Brother    Colon cancer Neg Hx     Medications: Patient's Medications  New Prescriptions   No medications on file  Previous Medications   ACETAMINOPHEN (TYLENOL) 325 MG TABLET    Take 650 mg by mouth every 6 (six) hours as needed.   ALBUTEROL (PROAIR HFA) 108 (90 BASE) MCG/ACT INHALER    INHALE 2 PUFFS INTO THE LUNGS EVERY SIX HOURS AS NEEDED FOR WHEEZING OR SHORTNESS OF BREATH   ALPRAZOLAM (XANAX) 0.5 MG TABLET    TAKE ONE TABLET BY MOUTH EVERY 8 HOURS AS NEEDED FOR ANXIETY   CITALOPRAM (CELEXA) 20 MG TABLET    TAKE ONE TABLET BY MOUTH DAILY   COLCHICINE 0.6 MG TABLET    TAKE TWO TABLETS BY MOUTH DAILY   GUAIFENESIN (MUCINEX PO)    Take by mouth as needed.   LOSARTAN (COZAAR) 100 MG TABLET  TAKE 1/2 TABLET BY MOUTH DAILY   MULTIPLE VITAMIN (MULTIVITAMIN) TABLET    Take 1 tablet by mouth daily.   ROSUVASTATIN (CRESTOR) 10 MG TABLET    TAKE 1 TABLET BY MOUTH DAILY  Modified Medications   No medications on file  Discontinued Medications   No medications on file    Physical Exam:  Vitals:   02/17/23 1329 02/17/23 1334  BP: (!) 164/90 (!) 160/78  Pulse: (!) 56   Resp: 16   Temp: 97.6 F (36.4 C)   SpO2: 96%   Weight: 151 lb (68.5 kg)   Height: 5\' 2"  (1.575 m)    Body mass index is 27.62 kg/m. Wt Readings from Last 3 Encounters:  02/17/23 151 lb (68.5 kg)  08/19/22 145 lb (65.8 kg)  08/19/22 143 lb 3.2 oz (65 kg)    Physical Exam Vitals reviewed.  Constitutional:      General: She is not in acute distress. HENT:     Head: Normocephalic.  Eyes:      General:        Right eye: No discharge.        Left eye: No discharge.  Cardiovascular:     Rate and Rhythm: Normal rate and regular rhythm.     Pulses: Normal pulses.     Heart sounds: Normal heart sounds.  Pulmonary:     Effort: Respiratory distress present.     Breath sounds: Normal breath sounds. No wheezing.  Abdominal:     Palpations: Abdomen is soft.  Skin:    General: Skin is warm.     Capillary Refill: Capillary refill takes less than 2 seconds.     Findings: Rash present.     Comments: Increased erythema between breasts and spreading under left breast, no vesicles or honey crusts, skin appears excoriated with mild odor  Neurological:     General: No focal deficit present.     Mental Status: She is alert and oriented to person, place, and time.  Psychiatric:        Mood and Affect: Mood normal.     Labs reviewed: Basic Metabolic Panel: Recent Labs    08/19/22 0812  NA 139  K 4.0  CL 104  CO2 24  GLUCOSE 107*  BUN 18  CREATININE 0.94  CALCIUM 8.9   Liver Function Tests: No results for input(s): "AST", "ALT", "ALKPHOS", "BILITOT", "PROT", "ALBUMIN" in the last 8760 hours. No results for input(s): "LIPASE", "AMYLASE" in the last 8760 hours. No results for input(s): "AMMONIA" in the last 8760 hours. CBC: Recent Labs    08/19/22 0812  WBC 9.4  NEUTROABS 6,242  HGB 12.9  HCT 38.2  MCV 89.0  PLT 234   Lipid Panel: Recent Labs    08/19/22 0812  CHOL 136  HDL 47*  LDLCALC 71  TRIG 93  CHOLHDL 2.9   TSH: No results for input(s): "TSH" in the last 8760 hours. A1C: Lab Results  Component Value Date   HGBA1C 6.0 (H) 08/19/2022     Assessment/Plan 1. Yeast infection of the skin - involves area between breasts and under left breast - skin with excoriated appearance, itchy, mild odor - admits to recent yard work, exposure to impetigo  - suspect fungal in nature due to location and rash appearance - fluconazole (DIFLUCAN) 150 MG tablet; Take 1  tablet (150 mg total) by mouth once for 1 dose. May repeat dose in 72 hours if not resolved.  Dispense: 2 tablet; Refill: 0 - nystatin  cream (MYCOSTATIN); Apply 1 Application topically 3 (three) times daily.  Dispense: 30 g; Refill: 5  Total time: 15 minutes. Greater than 50% of total time spent doing patient education regarding candidal skin rash including symptom/medication management.    Next appt: none  Scherry Ran  [facility] [phone]

## 2023-02-17 NOTE — Patient Instructions (Addendum)
Apply nystatin cream to rash three times daily for at least 10-14 days  Diflucan pill- take today and then repeat in 3 days if rash not improved  Try to wear more absorbant clothing when working outside/ "under armour material"  If you are not better by 08/19 please call office and let me know

## 2023-02-23 ENCOUNTER — Other Ambulatory Visit: Payer: Self-pay | Admitting: Orthopedic Surgery

## 2023-02-23 DIAGNOSIS — E782 Mixed hyperlipidemia: Secondary | ICD-10-CM

## 2023-04-04 ENCOUNTER — Other Ambulatory Visit (HOSPITAL_COMMUNITY): Payer: Self-pay | Admitting: Adult Health

## 2023-04-04 DIAGNOSIS — Z1231 Encounter for screening mammogram for malignant neoplasm of breast: Secondary | ICD-10-CM

## 2023-04-06 IMAGING — MG MM DIGITAL SCREENING BILAT W/ TOMO AND CAD
6 of 10 series · 6 of 30 positions shown · non-contrast
Comparison: Previous exam(s).

CLINICAL DATA: Screening.

EXAM:
DIGITAL SCREENING BILATERAL MAMMOGRAM WITH TOMOSYNTHESIS AND CAD
TECHNIQUE: Bilateral screening digital craniocaudal and mediolateral oblique
mammograms were obtained. Bilateral screening digital breast
tomosynthesis was performed. The images were evaluated with
computer-aided detection.

[R MLO synth-2D (1 of 2)]
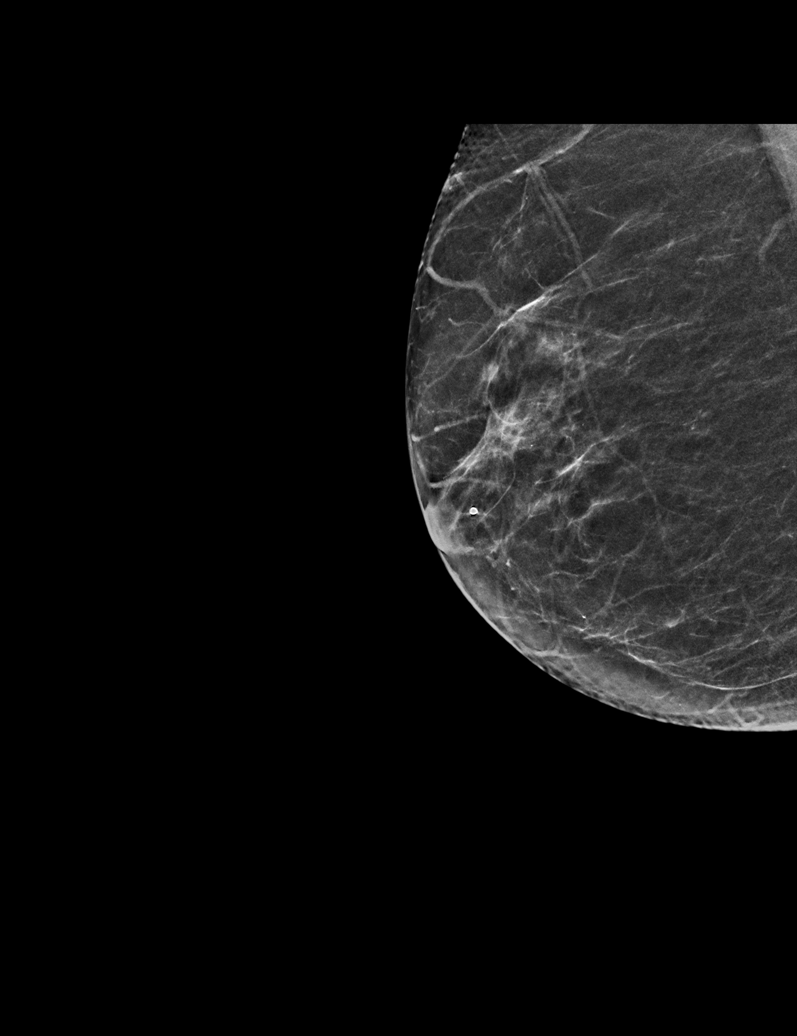

[L CC synth-2D]
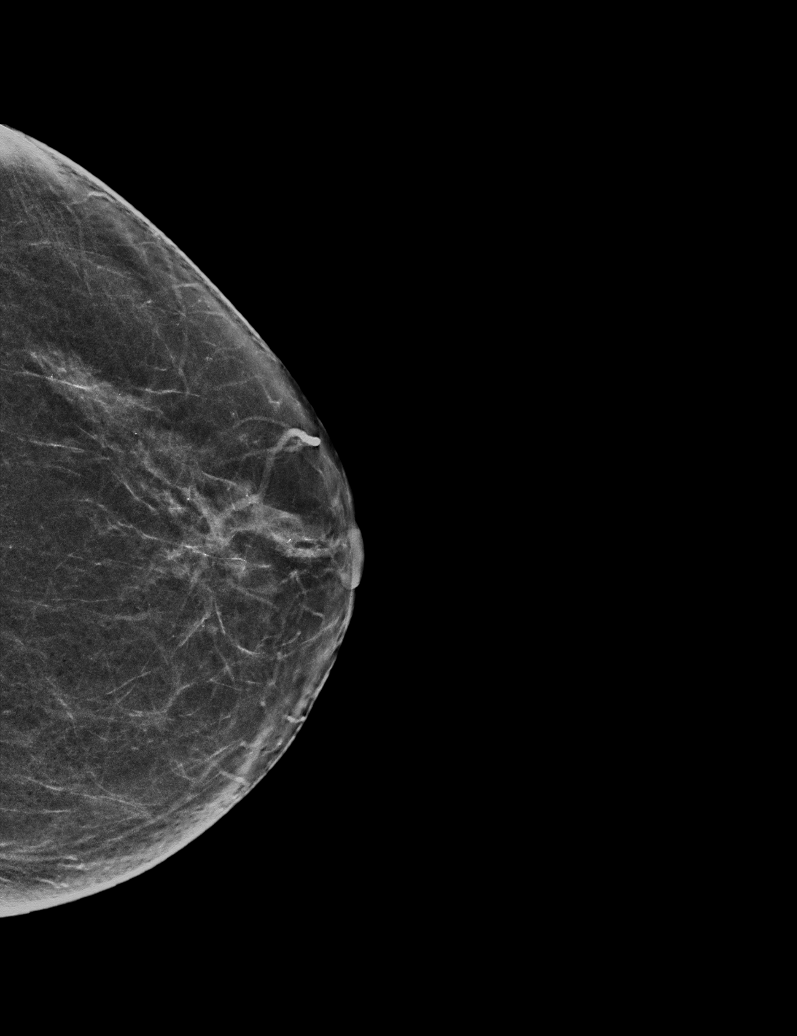

[L MLO synth-2D]
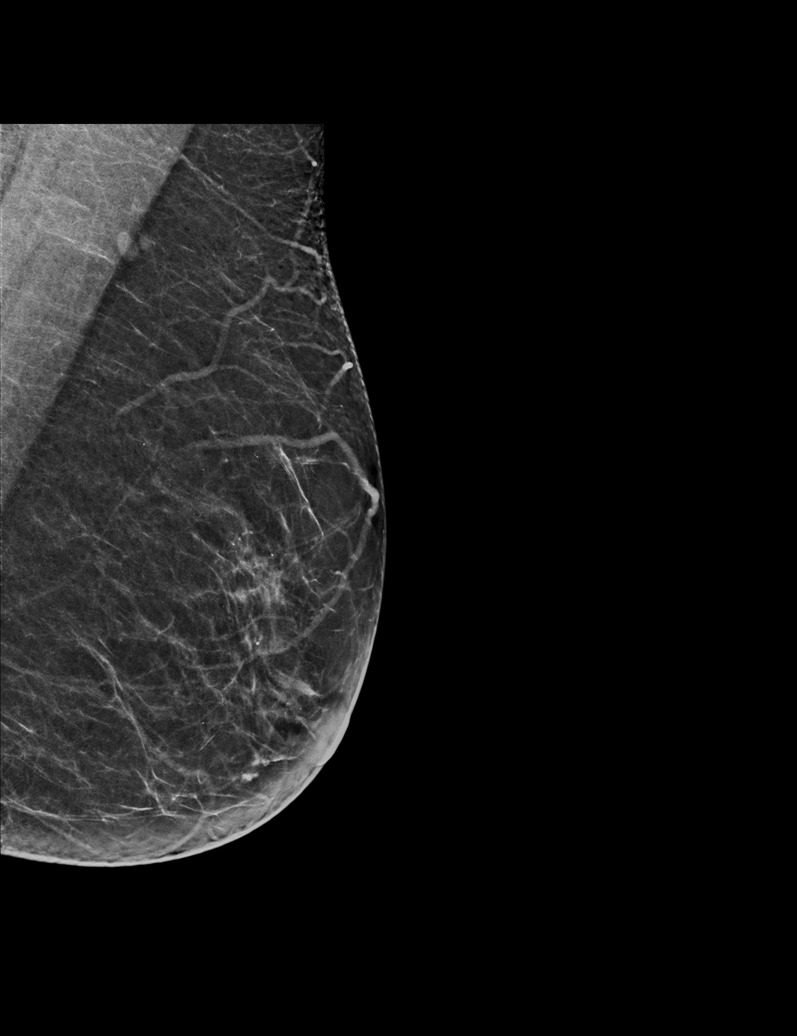

[R MLO synth-2D (2 of 2)]
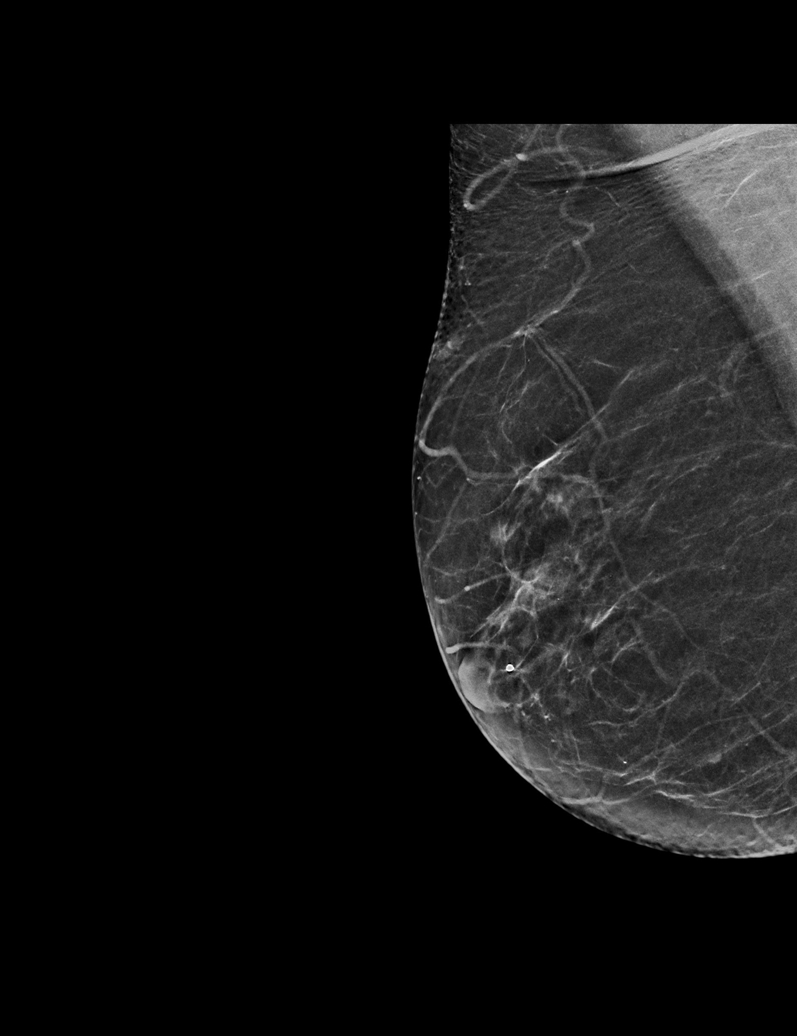

[R CC synth-2D]
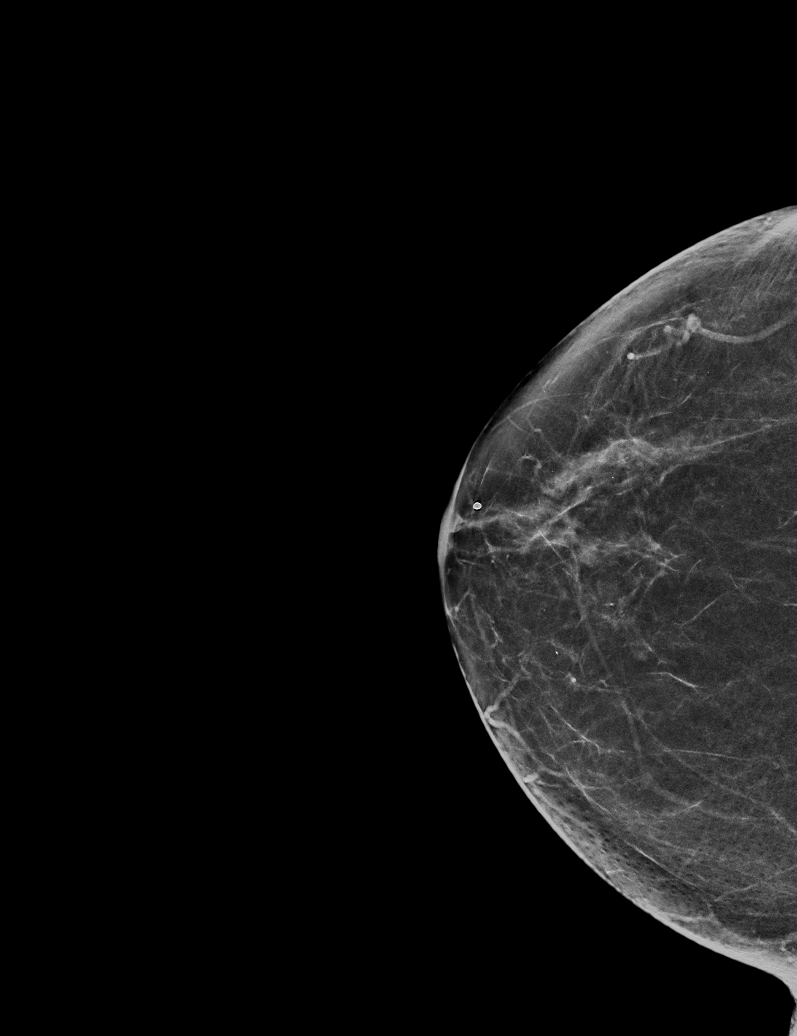

[R CC tomo · tomo slice 31/61.0]
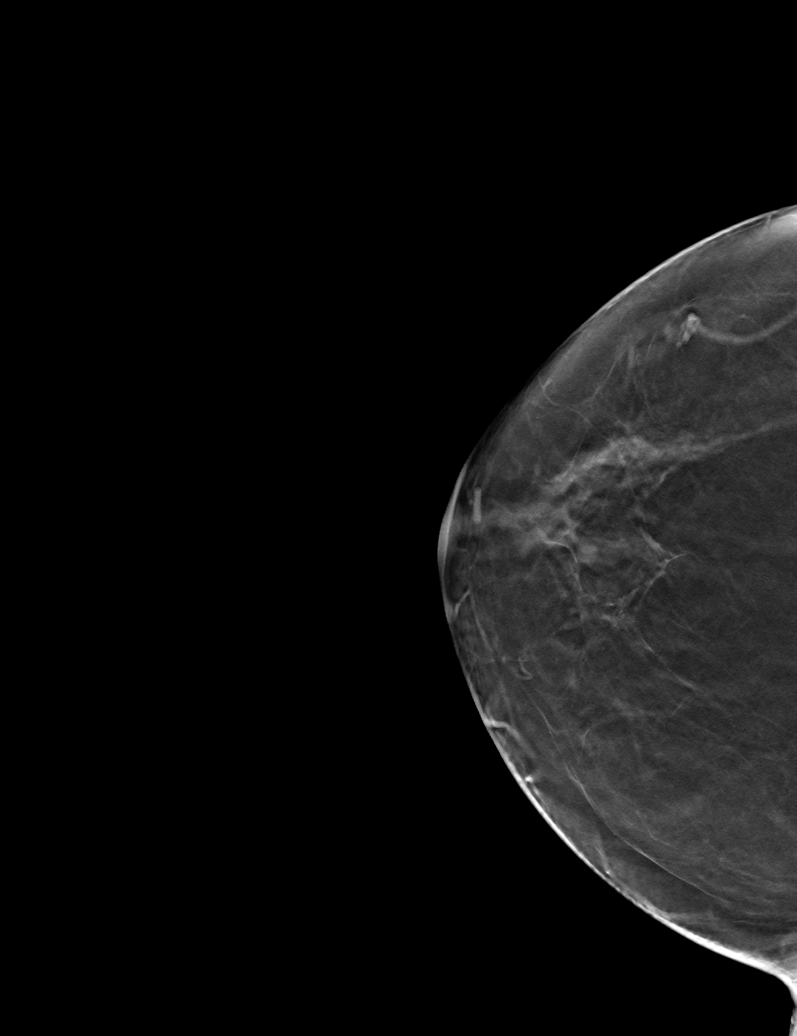

[6 of 30 positions shown; findings below may reference images not displayed]

ACR Breast Density Category b: There are scattered areas of
fibroglandular density.
FINDINGS: There are no findings suspicious for malignancy.
IMPRESSION: No mammographic evidence of malignancy. A result letter of this
screening mammogram will be mailed directly to the patient.

RECOMMENDATION:
Screening mammogram in one year. (Code:51-O-LD2)

BI-RADS CATEGORY  1: Negative.

## 2023-06-22 ENCOUNTER — Other Ambulatory Visit: Payer: Self-pay | Admitting: Adult Health

## 2023-06-23 ENCOUNTER — Telehealth: Payer: Self-pay | Admitting: Adult Health

## 2023-06-23 ENCOUNTER — Other Ambulatory Visit: Payer: Self-pay | Admitting: Adult Health

## 2023-06-23 MED ORDER — ALPRAZOLAM 0.5 MG PO TABS: 0.5000 mg | ORAL_TABLET | Freq: Three times a day (TID) | ORAL | 0 refills | Status: DC | PRN

## 2023-06-23 NOTE — Progress Notes (Signed)
Refilled xanax

## 2023-06-23 NOTE — Telephone Encounter (Signed)
Patient calling stating that she is out of refills on her alprazolam to [facility]

## 2023-08-15 ENCOUNTER — Encounter: Payer: Self-pay | Admitting: Orthopedic Surgery

## 2023-08-15 ENCOUNTER — Other Ambulatory Visit: Payer: Managed Care, Other (non HMO)

## 2023-08-15 DIAGNOSIS — E782 Mixed hyperlipidemia: Secondary | ICD-10-CM

## 2023-08-15 DIAGNOSIS — I129 Hypertensive chronic kidney disease with stage 1 through stage 4 chronic kidney disease, or unspecified chronic kidney disease: Secondary | ICD-10-CM

## 2023-08-15 DIAGNOSIS — R739 Hyperglycemia, unspecified: Secondary | ICD-10-CM

## 2023-08-15 DIAGNOSIS — I10 Essential (primary) hypertension: Secondary | ICD-10-CM

## 2023-08-15 MED ORDER — ALBUTEROL SULFATE HFA 108 (90 BASE) MCG/ACT IN AERS: INHALATION_SPRAY | RESPIRATORY_TRACT | 3 refills | Status: AC

## 2023-08-16 ENCOUNTER — Encounter: Payer: Self-pay | Admitting: Orthopedic Surgery

## 2023-08-16 LAB — CBC WITH DIFFERENTIAL/PLATELET
Absolute Lymphocytes: 676 {cells}/uL — ABNORMAL LOW (ref 850–3900)
Absolute Monocytes: 849 {cells}/uL (ref 200–950)
Basophils Absolute: 41 {cells}/uL (ref 0–200)
Basophils Relative: 0.6 %
Eosinophils Absolute: 331 {cells}/uL (ref 15–500)
Eosinophils Relative: 4.8 %
HCT: 40.1 % (ref 35.0–45.0)
Hemoglobin: 13.3 g/dL (ref 11.7–15.5)
MCH: 30.4 pg (ref 27.0–33.0)
MCHC: 33.2 g/dL (ref 32.0–36.0)
MCV: 91.8 fL (ref 80.0–100.0)
MPV: 10 fL (ref 7.5–12.5)
Monocytes Relative: 12.3 %
Neutro Abs: 5003 {cells}/uL (ref 1500–7800)
Neutrophils Relative %: 72.5 %
Platelets: 185 10*3/uL (ref 140–400)
RBC: 4.37 10*6/uL (ref 3.80–5.10)
RDW: 11.7 % (ref 11.0–15.0)
Total Lymphocyte: 9.8 %
WBC: 6.9 10*3/uL (ref 3.8–10.8)

## 2023-08-16 LAB — COMPLETE METABOLIC PANEL WITH GFR
AG Ratio: 1.4 (calc) (ref 1.0–2.5)
ALT: 13 U/L (ref 6–29)
AST: 15 U/L (ref 10–35)
Albumin: 4 g/dL (ref 3.6–5.1)
Alkaline phosphatase (APISO): 99 U/L (ref 37–153)
BUN/Creatinine Ratio: 16 (calc) (ref 6–22)
BUN: 18 mg/dL (ref 7–25)
CO2: 25 mmol/L (ref 20–32)
Calcium: 8.9 mg/dL (ref 8.6–10.4)
Chloride: 105 mmol/L (ref 98–110)
Creat: 1.12 mg/dL — ABNORMAL HIGH (ref 0.50–1.05)
Globulin: 2.8 g/dL (ref 1.9–3.7)
Glucose, Bld: 100 mg/dL — ABNORMAL HIGH (ref 65–99)
Potassium: 3.8 mmol/L (ref 3.5–5.3)
Sodium: 140 mmol/L (ref 135–146)
Total Bilirubin: 0.5 mg/dL (ref 0.2–1.2)
Total Protein: 6.8 g/dL (ref 6.1–8.1)
eGFR: 55 mL/min/{1.73_m2} — ABNORMAL LOW (ref >=60)

## 2023-08-16 LAB — LIPID PANEL
Cholesterol: 130 mg/dL (ref <200)
HDL: 57 mg/dL (ref >=50)
LDL Cholesterol (Calc): 59 mg/dL
Non-HDL Cholesterol (Calc): 73 mg/dL (ref <130)
Total CHOL/HDL Ratio: 2.3 (calc) (ref <5.0)
Triglycerides: 60 mg/dL (ref <150)

## 2023-08-16 LAB — HEMOGLOBIN A1C
Hgb A1c MFr Bld: 6.2 %{Hb} — ABNORMAL HIGH (ref <5.7)
Mean Plasma Glucose: 131 mg/dL
eAG (mmol/L): 7.3 mmol/L

## 2023-08-25 ENCOUNTER — Ambulatory Visit: Payer: Managed Care, Other (non HMO) | Admitting: Adult Health

## 2023-08-25 ENCOUNTER — Other Ambulatory Visit (HOSPITAL_COMMUNITY)
Admission: RE | Admit: 2023-08-25 | Discharge: 2023-08-25 | Disposition: A | Discharge status: Home / Self Care | Payer: Managed Care, Other (non HMO) | Source: Ambulatory Visit | Attending: Adult Health | Admitting: Adult Health

## 2023-08-25 ENCOUNTER — Encounter: Payer: Self-pay | Admitting: Adult Health

## 2023-08-25 ENCOUNTER — Ambulatory Visit (HOSPITAL_COMMUNITY): Payer: Managed Care, Other (non HMO)

## 2023-08-25 ENCOUNTER — Ambulatory Visit: Payer: Managed Care, Other (non HMO) | Admitting: Orthopedic Surgery

## 2023-08-25 ENCOUNTER — Encounter: Payer: Self-pay | Admitting: Orthopedic Surgery

## 2023-08-25 ENCOUNTER — Ambulatory Visit (HOSPITAL_COMMUNITY)
Admission: RE | Admit: 2023-08-25 | Discharge: 2023-08-25 | Disposition: A | Discharge status: Home / Self Care | Payer: Managed Care, Other (non HMO) | Source: Ambulatory Visit | Attending: Adult Health | Admitting: Adult Health

## 2023-08-25 VITALS — BP 198/92 | HR 58 | Ht 62.0 in | Wt 148.0 lb

## 2023-08-25 VITALS — BP 170/94 | Temp 97.8°F | Resp 21 | Ht 62.0 in | Wt 148.0 lb

## 2023-08-25 DIAGNOSIS — Z01419 Encounter for gynecological examination (general) (routine) without abnormal findings: Secondary | ICD-10-CM | POA: Insufficient documentation

## 2023-08-25 DIAGNOSIS — R7303 Prediabetes: Secondary | ICD-10-CM | POA: Diagnosis not present

## 2023-08-25 DIAGNOSIS — Z1231 Encounter for screening mammogram for malignant neoplasm of breast: Secondary | ICD-10-CM | POA: Insufficient documentation

## 2023-08-25 DIAGNOSIS — F339 Major depressive disorder, recurrent, unspecified: Secondary | ICD-10-CM

## 2023-08-25 DIAGNOSIS — F32A Depression, unspecified: Secondary | ICD-10-CM

## 2023-08-25 DIAGNOSIS — J019 Acute sinusitis, unspecified: Secondary | ICD-10-CM

## 2023-08-25 DIAGNOSIS — I129 Hypertensive chronic kidney disease with stage 1 through stage 4 chronic kidney disease, or unspecified chronic kidney disease: Secondary | ICD-10-CM | POA: Diagnosis not present

## 2023-08-25 DIAGNOSIS — E782 Mixed hyperlipidemia: Secondary | ICD-10-CM | POA: Diagnosis not present

## 2023-08-25 DIAGNOSIS — Z1331 Encounter for screening for depression: Secondary | ICD-10-CM | POA: Diagnosis not present

## 2023-08-25 DIAGNOSIS — Z1211 Encounter for screening for malignant neoplasm of colon: Secondary | ICD-10-CM | POA: Diagnosis not present

## 2023-08-25 LAB — HEMOCCULT GUIAC POC 1CARD (OFFICE): Fecal Occult Blood, POC: NEGATIVE

## 2023-08-25 MED ORDER — AMLODIPINE BESYLATE 5 MG PO TABS: 2.5000 mg | ORAL_TABLET | Freq: Every day | ORAL | 1 refills | Status: DC

## 2023-08-25 NOTE — Progress Notes (Signed)
 Patient ID: Cassandra Pineda, female   DOB: 04-29-1961, 63 y.o.   MRN: [id number] History of Present Illness: Cassandra Pineda is a 63 year old white female, married, PM, in for a well woman gyn exam and pap.  PCP is Cassandra Nordmann NP   Current Medications, Allergies, Past Medical History, Past Surgical History, Family History and Social History were reviewed in [employer] record.     Review of Systems: Patient denies any headaches, hearing loss, fatigue, blurred vision, shortness of breath, chest pain, abdominal pain, problems with bowel movements, urination, or intercourse. No joint pain or mood swings.  Denies any vaginal bleeding   Physical Exam:BP (!) 198/92 (BP Location: Right Arm, Patient Position: Sitting, Cuff Size: Normal)   Pulse (!) 58   Ht 5\' 2"  (1.575 m)   Wt 148 lb (67.1 kg)   BMI 27.07 kg/m   General:  Well developed, well nourished, no acute distress Skin:  Warm and dry Neck:  Midline trachea, normal thyroid, good ROM, no lymphadenopathy,no carotid bruits heard  Lungs; Clear to auscultation bilaterally Breast:  No dominant palpable mass, retraction, or nipple discharge Cardiovascular: Regular rate and rhythm Abdomen:  Soft, non tender, no hepatosplenomegaly Pelvic:  External genitalia is normal in appearance, no lesions.  The vagina is pale. Urethra has no lesions or masses. The cervix is smooth and stenotic at os, pap with HR HPV genotyping performed.  Uterus is felt to be normal size, shape, and contour.  No adnexal masses or tenderness noted.Bladder is non tender, no masses felt. Rectal: Good sphincter tone, no polyps, or hemorrhoids felt.  Hemoccult negative. Extremities/musculoskeletal:  No swelling or varicosities noted, no clubbing or cyanosis Psych:  No mood changes, alert and cooperative,seems happy AA is 0 Fall risk is low    08/25/2023   10:27 AM 08/19/2022   11:36 AM 08/13/2021   10:41 AM  Depression screen PHQ 2/9  Decreased Interest 0 0 0   Down, Depressed, Hopeless 0 0 0  PHQ - 2 Score 0 0 0  Altered sleeping 0 0 0  Tired, decreased energy 0 1 0  Change in appetite 0 0 0  Feeling bad or failure about yourself  0 0 0  Trouble concentrating 0 0 0  Moving slowly or fidgety/restless 0 0 0  Suicidal thoughts 0 0 0  PHQ-9 Score 0 1 0       08/25/2023   10:27 AM 08/19/2022   11:36 AM 08/13/2021   10:41 AM 08/11/2020    1:33 PM  GAD 7 : Generalized Anxiety Score  Nervous, Anxious, on Edge 0 0 0 0  Control/stop worrying 0 0 0 0  Worry too much - different things 0 0 0 0  Trouble relaxing 0 0 0 0  Restless 0 0 0 0  Easily annoyed or irritable 0 0 0 0  Afraid - awful might happen 0 0 0 0  Total GAD 7 Score 0 0 0 0    Upstream - 08/25/23 1023       Pregnancy Intention Screening   Does the patient want to become pregnant in the next year? N/A    Does the patient's partner want to become pregnant in the next year? N/A    Would the patient like to discuss contraceptive options today? N/A      Contraception Wrap Up   Current Method Female Sterilization   PM   End Method Female Sterilization   PM   Contraception Counseling Provided No  Examination chaperoned by Malachy Mood LPN    Impression and Plan: 1. Encounter for gynecological examination with Papanicolaou smear of cervix (Primary) Pap sent Physical in 1 year If pap negative, can be last pap Labs with PCP Had mammogram this morning Colonoscopy due this year  Stay active, still working  - Cytology - PAP( [facility])  2. Encounter for screening fecal occult blood testing Hemoccult was negative   3. Hypertension, renal disease, stage 1-4 or unspecified chronic kidney disease On meds, follow up with PCP, has appt to day   4. Depression, unspecified depression type  Doing good with Celexa 20 mg 1 daily, has refills

## 2023-08-25 NOTE — Progress Notes (Unsigned)
 Careteam: Patient Care Team: Octavia Heir, NP as PCP - General (Adult Health Nurse Practitioner)  Seen by: [name], AGNP-C  PLACE OF SERVICE:  [facility]  Advanced Directive information Does Patient Have a Medical Advance Directive?: No, Would patient like information on creating a medical advance directive?: No - Patient declined  Allergies  Allergen Reactions   Ace Inhibitors Cough    Chief Complaint  Patient presents with   Medical Management of Chronic Issues    1 year follow up. Discuss the need for Tdap, colonoscopy, pneumococcal, shingles and mammogram.     HPI: Patient is a 63 y.o. female seen today for medical management of chronic conditions.   Daughter present during encounter.   Recent lab work discussed with patient.   Elevated BP in office and at home. She is taking losartan 100 mg daily. Adherent to low sodium diet. BUN/creat 18/1.12 08/15/2023. Denies chest pain, shortness of breath, blurred vision. She is sometimes having headache to back of head. H/o nephrotic syndrome and past use of furosemide use.   A1c 6.2> was 6.0. Adherent to diet low in carbs and sugars.   Total cholesterol 130, LDL 59. Remains on rosuvastatin.   No mood changes. Na+ 140. Remains on citalopram.    Review of Systems:  Review of Systems  Constitutional: Negative.   HENT: Negative.    Eyes: Negative.   Respiratory: Negative.    Cardiovascular: Negative.   Gastrointestinal: Negative.   Genitourinary: Negative.   Musculoskeletal: Negative.   Skin: Negative.   Neurological:  Positive for headaches.  Psychiatric/Behavioral:  Positive for depression.     Past Medical History:  Diagnosis Date   Anxiety    Depression    Hyperlipidemia LDL goal < 100    Hypertension    Hypertension, renal disease    HTN related to a kidney disorder   Nephrotic syndrome    Renal disorder    Past Surgical History:  Procedure Laterality Date   RENAL BIOPSY  2013   [name]   TUBAL LIGATION  1993   Social History:   reports that she quit smoking about 41 years ago. Her smoking use included cigarettes. She has never used smokeless tobacco. She reports that she does not drink alcohol and does not use drugs.  Family History  Problem Relation Age of Onset   Heart disease Mother 48   Parkinson's disease Father    Asthma Sister    Hypertension Sister    Lymphoma Sister 69   Hypertension Brother    Heart disease Brother 45       heart attack   Hypertension Sister    Hypertension Brother    Colon cancer Neg Hx     Medications: Patient's Medications  New Prescriptions   No medications on file  Previous Medications   ACETAMINOPHEN (TYLENOL) 325 MG TABLET    Take 650 mg by mouth every 6 (six) hours as needed.   ALBUTEROL (PROAIR HFA) 108 (90 BASE) MCG/ACT INHALER    INHALE 2 PUFFS INTO THE LUNGS EVERY SIX HOURS AS NEEDED FOR WHEEZING OR SHORTNESS OF BREATH   ALPRAZOLAM (XANAX) 0.5 MG TABLET    Take 1 tablet (0.5 mg total) by mouth every 8 (eight) hours as needed. for anxiety   CITALOPRAM (CELEXA) 20 MG TABLET    TAKE 1 TABLET BY MOUTH DAILY   COLCHICINE 0.6 MG TABLET    TAKE TWO TABLETS BY MOUTH DAILY   GUAIFENESIN (MUCINEX PO)  Take by mouth as needed.   LOSARTAN (COZAAR) 100 MG TABLET    TAKE 1/2 TABLET BY MOUTH DAILY   MULTIPLE VITAMIN (MULTIVITAMIN) TABLET    Take 1 tablet by mouth daily.   ROSUVASTATIN (CRESTOR) 10 MG TABLET    TAKE 1 TABLET BY MOUTH DAILY  Modified Medications   No medications on file  Discontinued Medications   No medications on file    Physical Exam:  Vitals:   08/25/23 1420  BP: (!) 170/90  Resp: (!) 21  Temp: 97.8 F (36.6 C)  SpO2: 95%  Weight: 148 lb (67.1 kg)  Height: 5\' 2"  (1.575 m)   Body mass index is 27.07 kg/m. Wt Readings from Last 3 Encounters:  08/25/23 148 lb (67.1 kg)  08/25/23 148 lb (67.1 kg)  02/17/23 151 lb (68.5 kg)    Physical Exam Vitals reviewed.  Constitutional:       General: She is not in acute distress. HENT:     Head: Normocephalic.  Eyes:     General:        Right eye: No discharge.        Left eye: No discharge.  Cardiovascular:     Rate and Rhythm: Normal rate and regular rhythm.     Pulses: Normal pulses.     Heart sounds: Normal heart sounds.  Pulmonary:     Effort: Pulmonary effort is normal.     Breath sounds: Normal breath sounds.  Abdominal:     General: Bowel sounds are normal.     Palpations: Abdomen is soft.  Musculoskeletal:     Cervical back: Neck supple.     Right lower leg: No edema.     Left lower leg: No edema.  Skin:    General: Skin is warm.     Capillary Refill: Capillary refill takes less than 2 seconds.  Neurological:     General: No focal deficit present.     Mental Status: She is alert and oriented to person, place, and time.  Psychiatric:        Mood and Affect: Mood normal.     Labs reviewed: Basic Metabolic Panel: Recent Labs    08/15/23 0838  NA 140  K 3.8  CL 105  CO2 25  GLUCOSE 100*  BUN 18  CREATININE 1.12*  CALCIUM 8.9   Liver Function Tests: Recent Labs    08/15/23 0838  AST 15  ALT 13  BILITOT 0.5  PROT 6.8   No results for input(s): "LIPASE", "AMYLASE" in the last 8760 hours. No results for input(s): "AMMONIA" in the last 8760 hours. CBC: Recent Labs    08/15/23 0838  WBC 6.9  NEUTROABS 5,003  HGB 13.3  HCT 40.1  MCV 91.8  PLT 185   Lipid Panel: Recent Labs    08/15/23 0838  CHOL 130  HDL 57  LDLCALC 59  TRIG 60  CHOLHDL 2.3   TSH: No results for input(s): "TSH" in the last 8760 hours. A1C: Lab Results  Component Value Date   HGBA1C 6.2 (H) 08/15/2023     Assessment/Plan 1. 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  Dispense: 90 tablet; Refill: 1  2. Mixed hyperlipidemia - total cholesterol 130, LDL 59 - cont rosuvastatin - cont diet low in fat/ processed/ fried foods  3. Prediabetes - recent A1c 6.2> was 6.0 - cont diet low in sugars and carbs  4. Recurrent depression (HCC) - no mood changes - Na+ stable - cont citalopram  Total time: 34 minutes. Greater than 50% of total time spent doing patient education regarding health maintenance, HTN, HLD, prediabetes, and depression including symptom/medication management.    Next appt: 09/08/2023  [name], Juel Burrow  [facility] & Adult Medicine [phone]

## 2023-08-25 NOTE — Patient Instructions (Signed)
 Please start taking amlodipine with losartan  Please take blood pressures twice daily > BRING READINGS NEXT VISIT!!!!  Limit sodium intake to under 2000 mg daily

## 2023-08-26 LAB — CYTOLOGY - PAP
Comment: NEGATIVE
Diagnosis: NEGATIVE
High risk HPV: NEGATIVE

## 2023-09-08 ENCOUNTER — Encounter: Payer: Self-pay | Admitting: Orthopedic Surgery

## 2023-09-08 ENCOUNTER — Ambulatory Visit: Payer: Managed Care, Other (non HMO) | Admitting: Orthopedic Surgery

## 2023-09-08 DIAGNOSIS — I129 Hypertensive chronic kidney disease with stage 1 through stage 4 chronic kidney disease, or unspecified chronic kidney disease: Secondary | ICD-10-CM | POA: Diagnosis not present

## 2023-09-08 MED ORDER — AMLODIPINE BESYLATE 5 MG PO TABS: 5.0000 mg | ORAL_TABLET | Freq: Every day | ORAL | 1 refills | Status: DC

## 2023-09-08 NOTE — Patient Instructions (Signed)
 Increase amlodipine to 5 mg daily ( start taking 1 tablet daily)

## 2023-09-08 NOTE — Progress Notes (Signed)
 Careteam: Patient Care Team: Cassandra Heir, NP as PCP - General (Adult Health Nurse Practitioner)  Seen by: [name], AGNP-C  PLACE OF SERVICE:  [facility]  Advanced Directive information Does Patient Have a Medical Advance Directive?: No, Would patient like information on creating a medical advance directive?: No - Patient declined  Allergies  Allergen Reactions   Ace Inhibitors Cough    Chief Complaint  Patient presents with   Follow-up    3 week follow up on blood pressure.      HPI: Patient is a 63 y.o. female seen today for acute visit due to elevated blood pressure.   02/20 she c/o elevated blood pressure > 150/90. Asymptomatic. She was already taking losartan 50 mg daily. Adherent to low sodium diet. She was prescribed amlodipine 2.5 mg daily and advised to take blood pressures at home x 2 weeks. Today, home blood pressures have improved. Average SBP 160-130. She denies chest pain, shortness of breath, headaches or blurred vision.   Review of Systems:  Review of Systems  Constitutional: Negative.   HENT: Negative.    Eyes: Negative.   Respiratory: Negative.    Cardiovascular: Negative.   Skin: Negative.   Neurological: Negative.   Psychiatric/Behavioral: Negative.      Past Medical History:  Diagnosis Date   Anxiety    Depression    Hyperlipidemia LDL goal < 100    Hypertension    Hypertension, renal disease    HTN related to a kidney disorder   Nephrotic syndrome    Renal disorder    Past Surgical History:  Procedure Laterality Date   RENAL BIOPSY  2013   [name]   TUBAL LIGATION  1993   Social History:   reports that she quit smoking about 41 years ago. Her smoking use included cigarettes. She has never used smokeless tobacco. She reports that she does not drink alcohol and does not use drugs.  Family History  Problem Relation Age of Onset   Heart disease Mother 44   Parkinson's disease Father    Asthma Sister    Hypertension  Sister    Lymphoma Sister 36   Hypertension Brother    Heart disease Brother 45       heart attack   Hypertension Sister    Hypertension Brother    Colon cancer Neg Hx     Medications: Patient's Medications  New Prescriptions   No medications on file  Previous Medications   ACETAMINOPHEN (TYLENOL) 325 MG TABLET    Take 650 mg by mouth every 6 (six) hours as needed.   ALBUTEROL (PROAIR HFA) 108 (90 BASE) MCG/ACT INHALER    INHALE 2 PUFFS INTO THE LUNGS EVERY SIX HOURS AS NEEDED FOR WHEEZING OR SHORTNESS OF BREATH   ALPRAZOLAM (XANAX) 0.5 MG TABLET    Take 1 tablet (0.5 mg total) by mouth every 8 (eight) hours as needed. for anxiety   CITALOPRAM (CELEXA) 20 MG TABLET    TAKE 1 TABLET BY MOUTH DAILY   COLCHICINE 0.6 MG TABLET    TAKE TWO TABLETS BY MOUTH DAILY   GUAIFENESIN (MUCINEX PO)    Take by mouth as needed.   LOSARTAN (COZAAR) 100 MG TABLET    TAKE 1/2 TABLET BY MOUTH DAILY   MULTIPLE VITAMIN (MULTIVITAMIN) TABLET    Take 1 tablet by mouth daily.   ROSUVASTATIN (CRESTOR) 10 MG TABLET    TAKE 1 TABLET BY MOUTH DAILY  Modified Medications   Modified Medication  Previous Medication   AMLODIPINE (NORVASC) 5 MG TABLET amLODipine (NORVASC) 5 MG tablet      Take 1 tablet (5 mg total) by mouth daily.    Take 0.5 tablets (2.5 mg total) by mouth daily.  Discontinued Medications   No medications on file    Physical Exam:  Vitals:   09/08/23 1501  BP: (!) 140/82  Pulse: (!) 55  Resp: 18  Temp: 98.1 F (36.7 C)  SpO2: 95%  Weight: 147 lb 12.8 oz (67 kg)  Height: 5\' 2"  (1.575 m)   Body mass index is 27.03 kg/m. Wt Readings from Last 3 Encounters:  09/08/23 147 lb 12.8 oz (67 kg)  08/25/23 148 lb (67.1 kg)  08/25/23 148 lb (67.1 kg)    Physical Exam Vitals reviewed.  Constitutional:      General: She is not in acute distress. HENT:     Head: Normocephalic.  Eyes:     General:        Right eye: No discharge.        Left eye: No discharge.  Cardiovascular:     Rate  and Rhythm: Normal rate and regular rhythm.     Pulses: Normal pulses.     Heart sounds: Normal heart sounds.  Pulmonary:     Effort: Pulmonary effort is normal.     Breath sounds: Normal breath sounds.  Musculoskeletal:     Cervical back: Neck supple.     Right lower leg: No edema.     Left lower leg: No edema.  Skin:    General: Skin is warm.     Capillary Refill: Capillary refill takes less than 2 seconds.  Neurological:     General: No focal deficit present.     Mental Status: She is alert and oriented to person, place, and time.  Psychiatric:        Mood and Affect: Mood normal.     Labs reviewed: Basic Metabolic Panel: Recent Labs    08/15/23 0838  NA 140  K 3.8  CL 105  CO2 25  GLUCOSE 100*  BUN 18  CREATININE 1.12*  CALCIUM 8.9   Liver Function Tests: Recent Labs    08/15/23 0838  AST 15  ALT 13  BILITOT 0.5  PROT 6.8   No results for input(s): "LIPASE", "AMYLASE" in the last 8760 hours. No results for input(s): "AMMONIA" in the last 8760 hours. CBC: Recent Labs    08/15/23 0838  WBC 6.9  NEUTROABS 5,003  HGB 13.3  HCT 40.1  MCV 91.8  PLT 185   Lipid Panel: Recent Labs    08/15/23 0838  CHOL 130  HDL 57  LDLCALC 59  TRIG 60  CHOLHDL 2.3   TSH: No results for input(s): "TSH" in the last 8760 hours. A1C: Lab Results  Component Value Date   HGBA1C 6.2 (H) 08/15/2023     Assessment/Plan: 1. Hypertension, renal disease, stage 1-4 or unspecified chronic kidney disease - improved, uncontrolled, goal < 140/80 - BUN?creat 18/1.12 08/15/2023 - 02/20 started on amlodipine 2.5 mg daily> SBP was 160-190's - home blood pressures with SBP 140-160's - will increase amlodipine to 5 mg daily - continue home blood pressures and bring readings - cont low sodium diet  - cont losartan - amLODipine (NORVASC) 5 MG tablet; Take 1 tablet (5 mg total) by mouth daily.  Dispense: 90 tablet; Refill: 1  Total time: 21 minutes. Greater than 50% of total  time spent doing patient education regarding health  maintenance and HTN including symptom/medication management.    Next appt: Visit date not found  Maricela Schreur Scherry Ran  [facility] & Adult Medicine [phone]

## 2023-09-13 ENCOUNTER — Encounter: Payer: Self-pay | Admitting: Orthopedic Surgery

## 2023-09-13 ENCOUNTER — Other Ambulatory Visit: Payer: Self-pay | Admitting: Orthopedic Surgery

## 2023-09-23 ENCOUNTER — Encounter: Payer: Self-pay | Admitting: Gastroenterology

## 2023-10-04 ENCOUNTER — Encounter: Payer: Self-pay | Admitting: Internal Medicine

## 2023-11-07 ENCOUNTER — Other Ambulatory Visit: Payer: Self-pay | Admitting: Orthopedic Surgery

## 2023-11-07 DIAGNOSIS — K121 Other forms of stomatitis: Secondary | ICD-10-CM

## 2023-11-07 MED ORDER — LOSARTAN POTASSIUM 100 MG PO TABS: 50.0000 mg | ORAL_TABLET | Freq: Every day | ORAL | 1 refills | Status: DC

## 2023-11-07 NOTE — Telephone Encounter (Signed)
 Copied from CRM [id number]. Topic: Clinical - Medication Refill >> Nov 07, 2023 11:50 AM Tilton Fontan wrote: Most Recent Primary Care Visit:  Provider: Ulyses Gandy E  Department: [facility]  Visit Type: OFFICE VISIT  Date: 09/08/2023  Medication: losartan  (COZAAR ) 100 MG tablet [[id number]]  Per 09/13/23 message, patient advised to increase to 1 full tablet per day of Losartan . Patient currently takes half of 100mg  tablet. Patient will run out of medication soon. Please update prescription and send to [facility].    Has the patient contacted their pharmacy? Yes (Agent: If no, request that the patient contact the pharmacy for the refill. If patient does not wish to contact the pharmacy document the reason why and proceed with request.) (Agent: If yes, when and what did the pharmacy advise?)  Is this the correct pharmacy for this prescription? Yes If no, delete pharmacy and type the correct one.  This is the patient's preferred pharmacy:   [facility] [id number] [place], [place] - [address] [place] [address] Phone: [phone] Fax: [phone]   Has the prescription been filled recently? No  Is the patient out of the medication? Yes  Has the patient been seen for an appointment in the last year OR does the patient have an upcoming appointment? Yes  Can we respond through MyChart? No  Agent: Please be advised that Rx refills may take up to 3 business days. We ask that you follow-up with your pharmacy.

## 2023-11-10 ENCOUNTER — Encounter: Payer: Self-pay | Admitting: Orthopedic Surgery

## 2023-11-10 MED ORDER — LOSARTAN POTASSIUM 100 MG PO TABS: 100.0000 mg | ORAL_TABLET | Freq: Every day | ORAL | 1 refills | Status: DC

## 2023-11-10 NOTE — Telephone Encounter (Signed)
 Refer to mychart message dated 09/13/2023

## 2023-11-14 ENCOUNTER — Encounter (HOSPITAL_COMMUNITY): Payer: Self-pay

## 2023-11-21 ENCOUNTER — Ambulatory Visit (AMBULATORY_SURGERY_CENTER): Admitting: *Deleted

## 2023-11-21 ENCOUNTER — Encounter: Payer: Self-pay | Admitting: Internal Medicine

## 2023-11-21 VITALS — Ht 63.0 in | Wt 144.0 lb

## 2023-11-21 DIAGNOSIS — Z1211 Encounter for screening for malignant neoplasm of colon: Secondary | ICD-10-CM

## 2023-11-21 MED ORDER — NA SULFATE-K SULFATE-MG SULF 17.5-3.13-1.6 GM/177ML PO SOLN: 1.0000 | Freq: Once | ORAL | 0 refills | Status: AC

## 2023-11-21 NOTE — Progress Notes (Signed)
 Pre visit completed over telephone. Instructions sent through MyChart.    No egg or soy allergy known to patient  No issues known to pt with past sedation with any surgeries or procedures Patient denies ever being told they had issues or difficulty with intubation  No FH of Malignant Hyperthermia Pt is not on diet pills Pt is not on  home 02  Pt is not on blood thinners  Pt denies issues with constipation  No A fib or A flutter  NO Have any cardiac testing pending-- NO Pt instructed to use Singlecare.com or GoodRx for a price reduction on prep

## 2023-12-05 ENCOUNTER — Encounter: Payer: Self-pay | Admitting: Internal Medicine

## 2023-12-05 ENCOUNTER — Ambulatory Visit (AMBULATORY_SURGERY_CENTER): Admitting: Internal Medicine

## 2023-12-05 VITALS — BP 120/62 | HR 60 | Temp 97.8°F | Resp 14 | Ht 62.0 in | Wt 144.0 lb

## 2023-12-05 DIAGNOSIS — K648 Other hemorrhoids: Secondary | ICD-10-CM | POA: Diagnosis not present

## 2023-12-05 DIAGNOSIS — D122 Benign neoplasm of ascending colon: Secondary | ICD-10-CM

## 2023-12-05 DIAGNOSIS — D123 Benign neoplasm of transverse colon: Secondary | ICD-10-CM

## 2023-12-05 DIAGNOSIS — Z1211 Encounter for screening for malignant neoplasm of colon: Secondary | ICD-10-CM

## 2023-12-05 MED ORDER — SODIUM CHLORIDE 0.9 % IV SOLN: 500.0000 mL | INTRAVENOUS | Status: DC

## 2023-12-05 NOTE — Progress Notes (Signed)
 Sedate, gd SR, tolerated procedure well, VSS, report to RN

## 2023-12-05 NOTE — Patient Instructions (Addendum)

## 2023-12-05 NOTE — Progress Notes (Signed)
 Pt's states no medical or surgical changes since previsit or office visit.

## 2023-12-05 NOTE — Op Note (Signed)
 [facility] Patient Name: Cassandra Pineda Procedure Date: 12/05/2023 8:43 AM MRN: [id number] Endoscopist: Freada Jacobs Piedra , , [id number] Age: 63 Referring MD:  Date of Birth: [birth year] Gender: Female Account #: [id number] Procedure:                Colonoscopy Indications:              Screening for colorectal malignant neoplasm Medicines:                Monitored Anesthesia Care Procedure:                Pre-Anesthesia Assessment:                           - Prior to the procedure, a History and Physical                            was performed, and patient medications and                            allergies were reviewed. The patient's tolerance of                            previous anesthesia was also reviewed. The risks                            and benefits of the procedure and the sedation                            options and risks were discussed with the patient.                            All questions were answered, and informed consent                            was obtained. Prior Anticoagulants: The patient has                            taken no anticoagulant or antiplatelet agents. ASA                            Grade Assessment: II - A patient with mild systemic                            disease. After reviewing the risks and benefits,                            the patient was deemed in satisfactory condition to                            undergo the procedure.                           After obtaining informed consent, the colonoscope  was passed under direct vision. Throughout the                            procedure, the patient's blood pressure, pulse, and                            oxygen saturations were monitored continuously. The                            Olympus CF-HQ190L ([id number]) Colonoscope was                            introduced through the anus and advanced to the the                            terminal  ileum. The colonoscopy was performed                            without difficulty. The patient tolerated the                            procedure well. The quality of the bowel                            preparation was good. The terminal ileum, ileocecal                            valve, appendiceal orifice, and rectum were                            photographed. Scope In: 8:55:21 AM Scope Out: 9:12:53 AM Scope Withdrawal Time: 0 hours 14 minutes 48 seconds  Total Procedure Duration: 0 hours 17 minutes 32 seconds  Findings:                 The terminal ileum appeared normal.                           Three sessile polyps were found in the transverse                            colon and ascending colon. The polyps were 3 to 7                            mm in size. These polyps were removed with a cold                            snare. Resection and retrieval were complete.                           Non-bleeding internal hemorrhoids were found during                            retroflexion. Complications:  No immediate complications. Estimated Blood Loss:     Estimated blood loss was minimal. Impression:               - The examined portion of the ileum was normal.                           - Three 3 to 7 mm polyps in the transverse colon                            and in the ascending colon, removed with a cold                            snare. Resected and retrieved.                           - Non-bleeding internal hemorrhoids. Recommendation:           - Discharge patient to home (with escort).                           - Await pathology results.                           - The findings and recommendations were discussed                            with the patient. Dr Pedro Bourgeois "[place]" [place],  12/05/2023 9:16:04 AM

## 2023-12-05 NOTE — Progress Notes (Signed)
 Called to room to assist during endoscopic procedure.  Patient ID and intended procedure confirmed with present staff. Received instructions for my participation in the procedure from the performing physician.

## 2023-12-05 NOTE — Progress Notes (Signed)
 GASTROENTEROLOGY PROCEDURE H&P NOTE   Primary Care Physician: Arnetha Bhat, NP    Reason for Procedure:   Colon cancer screening  Plan:    Colonoscopy  Patient is appropriate for endoscopic procedure(s) in the ambulatory ([facility]) setting.  The nature of the procedure, as well as the risks, benefits, and alternatives were carefully and thoroughly reviewed with the patient. Ample time for discussion and questions allowed. The patient understood, was satisfied, and agreed to proceed.     HPI: Cassandra Pineda is a 63 y.o. female who presents for colonoscopy for colon cancer screening. Denies blood in stools, changes in bowel habits, or unintentional weight loss. Denies family history of colon cancer. Last colonoscopy in 2015 was normal.   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 Authorizing Provider  albuterol  (PROAIR  HFA) 108 (90 Base) MCG/ACT inhaler INHALE 2 PUFFS INTO THE LUNGS EVERY SIX HOURS AS NEEDED FOR WHEEZING OR SHORTNESS OF BREATH 08/15/23  Yes Fargo, Amy E, NP  citalopram  (CELEXA ) 20 MG tablet TAKE 1 TABLET BY MOUTH DAILY 06/22/23  Yes Lendia Quay A, NP  colchicine  0.6 MG tablet TAKE 2 TABLETS BY MOUTH DAILY 11/07/23  Yes Fargo, Amy E, NP  guaiFENesin (MUCINEX PO) Take by mouth as needed.   Yes [provider]  losartan  (COZAAR ) 100 MG tablet Take 1 tablet (100 mg total) by mouth daily. 11/10/23  Yes Fargo, Amy E, NP  Multiple Vitamin (MULTIVITAMIN) tablet Take 1 tablet by mouth daily.   Yes [provider]  rosuvastatin  (CRESTOR ) 10 MG tablet TAKE 1 TABLET BY MOUTH DAILY  02/23/23  Yes Fargo, Amy E, NP  acetaminophen (TYLENOL) 325 MG tablet Take 650 mg by mouth every 6 (six) hours as needed.    [provider]  ALPRAZolam  (XANAX ) 0.5 MG tablet Take 1 tablet (0.5 mg total) by mouth every 8 (eight) hours as needed. for anxiety 06/23/23   Javan Messing, NP    Current Outpatient Medications  Medication Sig Dispense Refill   albuterol  (PROAIR  HFA) 108 (90 Base) MCG/ACT inhaler INHALE 2 PUFFS INTO THE LUNGS EVERY SIX HOURS AS NEEDED FOR WHEEZING OR SHORTNESS OF BREATH 18 g 3   citalopram  (CELEXA ) 20 MG tablet TAKE 1 TABLET BY MOUTH DAILY 90 tablet 2   colchicine  0.6 MG tablet TAKE 2 TABLETS BY MOUTH DAILY 180 tablet 1   guaiFENesin (MUCINEX PO) Take by mouth as needed.     losartan  (COZAAR ) 100 MG tablet Take 1 tablet (100 mg total) by mouth daily. 90 tablet 1   Multiple Vitamin (MULTIVITAMIN) tablet Take 1 tablet by mouth daily.     rosuvastatin  (CRESTOR ) 10 MG tablet TAKE 1 TABLET BY MOUTH DAILY 90 tablet 3   acetaminophen (TYLENOL) 325 MG tablet Take 650 mg by mouth every 6 (six) hours as needed.     ALPRAZolam  (XANAX ) 0.5 MG tablet Take 1 tablet (0.5 mg total) by mouth every 8 (eight) hours as needed. for anxiety 60 tablet 0   Current  Facility-Administered Medications  Medication Dose Route Frequency Provider Last Rate Last Admin   0.9 %  sodium chloride  infusion  500 mL Intravenous Continuous Daina Drum, MD        Allergies as of 12/05/2023 - Review Complete 12/05/2023  Allergen Reaction Noted   Ace inhibitors Cough 06/15/2013    Family History  Problem Relation Age of Onset   Heart disease Mother 16   Parkinson's disease Father    Asthma Sister    Hypertension Sister    Lymphoma Sister 101   Hypertension Sister    Hypertension Brother    Heart disease Brother 45       heart attack   Hypertension Brother    Colon cancer Neg Hx    Colon polyps Neg Hx     Social History   Socioeconomic History   Marital status: Married     Spouse name: Not on file   Number of children: Not on file   Years of education: Not on file   Highest education level: 12th grade  Occupational History   Not on file  Tobacco Use   Smoking status: Former    Current packs/day: 0.00    Types: Cigarettes    Quit date: 07/05/1982    Years since quitting: 41.4   Smokeless tobacco: Never  Vaping Use   Vaping status: Never Used  Substance and Sexual Activity   Alcohol use: No   Drug use: No   Sexual activity: Yes    Birth control/protection: Post-menopausal, Surgical    Comment: tubal  Other Topics Concern   Not on file  Social History Narrative   Not on file   Social Drivers of Health   Financial Resource Strain: Low Risk  (08/25/2023)   Overall Financial Resource Strain (CARDIA)    Difficulty of Paying Living Expenses: Not hard at all  Food Insecurity: No Food Insecurity (08/25/2023)   Hunger Vital Sign    Worried About Running Out of Food in the Last Year: Never true    Ran Out of Food in the Last Year: Never true  Transportation Needs: No Transportation Needs (08/25/2023)   PRAPARE - Administrator, Civil Service (Medical): No    Lack of Transportation (Non-Medical): No  Physical Activity: Insufficiently Active (08/25/2023)   Exercise Vital Sign    Days of Exercise per Week: 3 days    Minutes of Exercise per Session: 30 min  Stress: No Stress Concern Present (08/25/2023)   [employer] of Occupational Health - Occupational Stress Questionnaire    Feeling of Stress : Not at all  Social Connections: Socially Integrated (08/25/2023)   Social Connection and Isolation Panel [NHANES]    Frequency of Communication with Friends and Family: More than three times a week    Frequency of Social Gatherings with Friends and Family: Once a week    Attends Religious Services: More than 4 times per year    Active Member of [employer] or Organizations: Yes    Attends Engineer, structural: More than 4 times per year     Marital Status: Married  Catering manager Violence: Not At Risk (08/25/2023)   Humiliation, Afraid, Rape, and Kick questionnaire    Fear of Current or Ex-Partner: No    Emotionally Abused: No    Physically Abused: No    Sexually Abused: No    Physical Exam: Vital signs in last 24 hours: BP 133/75   Pulse (!) 56   Temp 97.8 F (36.6  C)   Ht 5\' 2"  (1.575 m)   Wt 144 lb (65.3 kg)   SpO2 92%   BMI 26.34 kg/m  GEN: NAD EYE: Sclerae anicteric ENT: MMM CV: Non-tachycardic Pulm: No increased work of breathing GI: Soft, NT/ND NEURO:  Alert & Oriented   Regino Caprio, MD [facility]  12/05/2023 8:28 AM

## 2023-12-06 ENCOUNTER — Telehealth: Payer: Self-pay

## 2023-12-06 NOTE — Telephone Encounter (Signed)
  Follow up Call-     12/05/2023    8:17 AM  Call back number  Post procedure Call Back phone  # [phone]  Permission to leave phone message Yes     Patient questions:  Do you have a fever, pain , or abdominal swelling? No. Pain Score  0 *  Have you tolerated food without any problems? Yes.    Have you been able to return to your normal activities? Yes.    Do you have any questions about your discharge instructions: Diet   No. Medications  No. Follow up visit  No.  Do you have questions or concerns about your Care? No.  Actions: * If pain score is 4 or above: No action needed, pain <4.

## 2023-12-08 ENCOUNTER — Ambulatory Visit: Payer: Self-pay | Admitting: Internal Medicine

## 2023-12-08 LAB — SURGICAL PATHOLOGY

## 2023-12-10 ENCOUNTER — Emergency Department (HOSPITAL_BASED_OUTPATIENT_CLINIC_OR_DEPARTMENT_OTHER)

## 2023-12-10 ENCOUNTER — Encounter (HOSPITAL_BASED_OUTPATIENT_CLINIC_OR_DEPARTMENT_OTHER): Payer: Self-pay | Admitting: *Deleted

## 2023-12-10 ENCOUNTER — Emergency Department (HOSPITAL_BASED_OUTPATIENT_CLINIC_OR_DEPARTMENT_OTHER): Admitting: Radiology

## 2023-12-10 ENCOUNTER — Emergency Department (HOSPITAL_BASED_OUTPATIENT_CLINIC_OR_DEPARTMENT_OTHER)
Admission: EM | Admit: 2023-12-10 | Discharge: 2023-12-10 | Disposition: A | Discharge status: Home / Self Care | Attending: Emergency Medicine | Admitting: Emergency Medicine

## 2023-12-10 ENCOUNTER — Other Ambulatory Visit: Payer: Self-pay

## 2023-12-10 DIAGNOSIS — S92012A Displaced fracture of body of left calcaneus, initial encounter for closed fracture: Secondary | ICD-10-CM | POA: Insufficient documentation

## 2023-12-10 DIAGNOSIS — W010XXA Fall on same level from slipping, tripping and stumbling without subsequent striking against object, initial encounter: Secondary | ICD-10-CM | POA: Diagnosis not present

## 2023-12-10 DIAGNOSIS — Z23 Encounter for immunization: Secondary | ICD-10-CM | POA: Insufficient documentation

## 2023-12-10 DIAGNOSIS — Z79899 Other long term (current) drug therapy: Secondary | ICD-10-CM | POA: Diagnosis not present

## 2023-12-10 DIAGNOSIS — S99912A Unspecified injury of left ankle, initial encounter: Secondary | ICD-10-CM | POA: Diagnosis present

## 2023-12-10 DIAGNOSIS — I10 Essential (primary) hypertension: Secondary | ICD-10-CM | POA: Diagnosis not present

## 2023-12-10 MED ORDER — HYDROCODONE-ACETAMINOPHEN 5-325 MG PO TABS: 1.0000 | ORAL_TABLET | ORAL | 0 refills | Status: DC | PRN

## 2023-12-10 MED ORDER — TETANUS-DIPHTH-ACELL PERTUSSIS 5-2.5-18.5 LF-MCG/0.5 IM SUSY
0.5000 mL | PREFILLED_SYRINGE | Freq: Once | INTRAMUSCULAR | Status: AC
  Administered 2023-12-10: 0.5 mL via INTRAMUSCULAR
  Filled 2023-12-10: qty 0.5

## 2023-12-10 NOTE — ED Provider Notes (Signed)
 [place] [facility] AT [facility] Provider Note   CSN: [id number] Arrival date & time: 12/10/23  1508     History  Chief Complaint  Patient presents with   Ankle Pain    Cassandra Pineda is a 63 y.o. female.  Patient is a 63 year old female who presents with pain in her left foot.  She states she was getting off a trailer and slipped, landing straight down onto her left foot.  She is complaining of pain in her left foot and ankle.  She says it hurts to bear weight.  She has an abrasion to her left arm but denies any other injuries.  She denies any neck or back pain.  She did not hit her head.  No loss of consciousness.  She is not on blood thinners.  She said she took over-the-counter medication before she got here and denies the need for any pain medication.  She says she is feels okay right now.  She does not know when her last tetanus shot was.       Home Medications Prior to Admission medications   Medication Sig Start Date End Date Taking? Authorizing Provider  HYDROcodone-acetaminophen (NORCO/VICODIN) 5-325 MG tablet Take 1-2 tablets by mouth every 4 (four) hours as needed. 12/10/23  Yes Hershel Los, MD  albuterol  (PROAIR  HFA) 108 (90 Base) MCG/ACT inhaler INHALE 2 PUFFS INTO THE LUNGS EVERY SIX HOURS AS NEEDED FOR WHEEZING OR SHORTNESS OF BREATH 08/15/23   Fargo, Amy E, NP  ALPRAZolam  (XANAX ) 0.5 MG tablet Take 1 tablet (0.5 mg total) by mouth every 8 (eight) hours as needed. for anxiety 06/23/23   Lendia Quay A, NP  citalopram  (CELEXA ) 20 MG tablet TAKE 1 TABLET BY MOUTH DAILY 06/22/23   Lendia Quay A, NP  colchicine  0.6 MG tablet TAKE 2 TABLETS BY MOUTH DAILY 11/07/23   Fargo, Amy E, NP  guaiFENesin (MUCINEX PO) Take by mouth as needed.    [provider]  losartan  (COZAAR ) 100 MG tablet Take 1 tablet (100 mg total) by mouth daily. 11/10/23   Fargo, Amy E, NP  Multiple Vitamin (MULTIVITAMIN) tablet Take 1 tablet by mouth daily.     [provider]  rosuvastatin  (CRESTOR ) 10 MG tablet TAKE 1 TABLET BY MOUTH DAILY 02/23/23   Celina Colla, Amy E, NP      Allergies    Ace inhibitors    Review of Systems   Review of Systems  Constitutional:  Negative for fever.  Gastrointestinal:  Negative for nausea and vomiting.  Musculoskeletal:  Positive for arthralgias and joint swelling. Negative for back pain and neck pain.  Skin:  Negative for wound.  Neurological:  Negative for syncope, weakness, numbness and headaches.    Physical Exam Updated Vital Signs BP 113/68 (BP Location: Right Arm)   Pulse 72   Temp 98.7 F (37.1 C)   Resp 18   Ht 5\' 3"  (1.6 m)   Wt 65.3 kg   SpO2 95%   BMI 25.51 kg/m  Physical Exam Constitutional:      Appearance: She is well-developed.  HENT:     Head: Normocephalic and atraumatic.  Cardiovascular:     Rate and Rhythm: Normal rate.  Pulmonary:     Effort: Pulmonary effort is normal.  Musculoskeletal:        General: Tenderness present.     Cervical back: Normal range of motion and neck supple.     Comments: Positive swelling around the lateral malleolus of the left  ankle.  There are some generalized swelling to the ankle.  There is tenderness to the posterior ankle and around the calcaneus.  There is some tenderness over the lateral malleolus of the ankle.  No other bony tenderness to the foot.  No pain to the lower leg or knee.  Pedal pulses are intact.  She has normal sensation and motor function distally.  No wounds.  Skin:    General: Skin is warm and dry.  Neurological:     Mental Status: She is alert and oriented to person, place, and time.     ED Results / Procedures / Treatments   Labs (all labs ordered are listed, but only abnormal results are displayed) Labs Reviewed - No data to display  EKG None  Radiology CT Ankle Left Wo Contrast Result Date: 12/10/2023 CLINICAL DATA:  Ankle trauma, fracture, xray done (Age >= 5y). EXAM: CT OF THE LEFT ANKLE WITHOUT CONTRAST  TECHNIQUE: Multidetector CT imaging of the left ankle was performed according to the standard protocol. Multiplanar CT image reconstructions were also generated. RADIATION DOSE REDUCTION: This exam was performed according to the departmental dose-optimization program which includes automated exposure control, adjustment of the mA and/or kV according to patient size and/or use of iterative reconstruction technique. COMPARISON:  None Available. FINDINGS: Bones/Joint/Cartilage There is extensive comminuted and displaced fracture of the calcaneus with intra-articular extension. There is also subtle avulsion fracture along the posterior process of talus. No other acute fracture or dislocation. Calcaneal spur noted along the Achilles tendon and plantar aponeurosis attachment sites. No aggressive osseous lesion. Ligaments Suboptimally assessed by CT. Muscles and Tendons Suboptimally assessed by CT scan.  No large hematoma seen. Soft tissues There is soft tissue swelling/hematoma along the lateral aspect of the ankle joint. IMPRESSION: 1. Extensive comminuted and displaced fracture of the calcaneus with intra-articular extension. 2. Subtle avulsion fracture along the posterior process of talus. Electronically Signed   By: Beula Brunswick M.D.   On: 12/10/2023 17:08   DG Ankle Complete Left Result Date: 12/10/2023 CLINICAL DATA:  Fall and left ankle swelling. EXAM: LEFT ANKLE COMPLETE - 3+ VIEW COMPARISON:  None Available. FINDINGS: There is nondisplaced fracture of the superior cortex of the posterior calcaneus. No other acute fracture. There is no dislocation. There is a 12 mm plantar calcaneal spur. The bones are osteopenic. The soft tissue swelling over the lateral ankle. IMPRESSION: Nondisplaced fracture of the posterior calcaneus. Electronically Signed   By: Angus Bark M.D.   On: 12/10/2023 15:53    Procedures Procedures    Medications Ordered in ED Medications  Tdap (BOOSTRIX) injection 0.5 mL (0.5 mLs  Intramuscular Given 12/10/23 1651)    ED Course/ Medical Decision Making/ A&P                                 Medical Decision Making Amount and/or Complexity of Data Reviewed Radiology: ordered.  Risk Prescription drug management.   This patient presents to the ED for concern of foot pain, this involves an extensive number of treatment options, and is a complaint that carries with it a high risk of complications and morbidity.  I considered the following differential and admission for this acute, potentially life threatening condition.  The differential diagnosis includes fracture, sprain, contusion, compartment syndrome, infection  MDM:    Patient presents with pain in her foot after a fall.  She has pain over the ankle and heel.  No open wounds.  She does not have any associated pain over her spine.  She has some tingling but has normal sensation to light touch in the foot.  Pedal pulses are intact.  No clinical suggestions of compartment syndrome.  She is not requiring any pain medication.  X-rays show evidence of a calcaneus fracture.  Discussed with Dr. Curtiss Dowdy who recommends CT scan and discharge following that.  Was placed in a bulky Jones dressing with a posterior splint.  Was advised on ice and elevation.  Was instructed on signs to look out for for compartment syndrome.  Was advised to return for any of the signs or other worsening signs.  Was advised to call Monday to make an appointment to follow-up with Dr. Curtiss Dowdy.  (Labs, imaging, consults)  Labs: I Ordered, and personally interpreted labs.  The pertinent results include: None  Imaging Studies ordered: I ordered imaging studies including x-ray, CT scan I independently visualized and interpreted imaging. I agree with the radiologist interpretation  Additional history obtained from chart.  External records from outside source obtained and reviewed including notes  Cardiac Monitoring: The patient was not maintained on a  cardiac monitor.  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    Dispense:  12 tablet    Refill:  0    I have reviewed the patients home medicines and have made adjustments as needed  Problem List / ED Course: Problem List Items Addressed This Visit   None Visit Diagnoses       Closed displaced fracture of body of left calcaneus, initial encounter    -  Primary             Final Clinical Impression(s) / ED Diagnoses Final diagnoses:  Closed displaced fracture of body of left calcaneus, initial encounter    Rx / DC Orders ED Discharge Orders          Ordered    HYDROcodone-acetaminophen (NORCO/VICODIN) 5-325 MG tablet  Every 4 hours PRN        12/10/23 1734              Hershel Los, MD 12/10/23 1738

## 2023-12-10 NOTE — ED Triage Notes (Addendum)
 Patient to ED after falling 4-5 feet off a trailer onto grass. Patient fell on all fours ad reporting left ankle pain with swelling and inability to bear weight. Bruising to forearms. No blood thinners, no loss of consciousness and no head injury.   Patient took 800mg  ibuprofen without relief in pain.

## 2023-12-10 NOTE — ED Notes (Signed)
 As I write this, our tech., Jacob is beginning the splinting process.

## 2023-12-10 NOTE — Discharge Instructions (Signed)
 Keep your leg elevated is much as possible.  Use ice to the area to help with the swelling.  Do not put any weight on the foot until cleared by orthopedics.  Call on Monday morning to make an appointment to follow-up with the orthopedic surgeon.  You can use Tylenol for pain.  You have been given a prescription for Vicodin as well.  This has Tylenol already and it so do not take the 2 medications together.  Do not drive while taking the Vicodin.  Return to the emergency room if you have any worsening symptoms such as increased pain, increased numbness to the foot, discoloration of the toes or other worsening symptoms.

## 2023-12-12 ENCOUNTER — Ambulatory Visit: Payer: Self-pay | Admitting: Student

## 2023-12-12 ENCOUNTER — Other Ambulatory Visit: Payer: Self-pay

## 2023-12-12 ENCOUNTER — Encounter (HOSPITAL_COMMUNITY): Payer: Self-pay | Admitting: Student

## 2023-12-12 NOTE — Progress Notes (Signed)
 SDW CALL  Patient was given pre-op instructions over the phone. The opportunity was given for the patient to ask questions. No further questions asked. Patient verbalized understanding of instructions given.   PCP - Arnetha Bhat, NP  Cardiologist -   PPM/ICD - denies Device Orders - n/a Rep Notified - n/a  Chest x-ray - denies EKG - denies Stress Test - denies ECHO - denies Cardiac Cath - denies  Sleep Study - denies CPAP - n/a  DM -denies  Blood Thinner Instructions:denies Aspirin Instructions:n/a  ERAS Protcol - clear liquids until 7:15    COVID TEST- n/a   Anesthesia review: no  Patient denies shortness of breath, fever, cough and chest pain over the phone call   All instructions explained to the patient, with a verbal understanding of the material. Patient agrees to go over the instructions while at home for a better understanding.

## 2023-12-13 NOTE — Anesthesia Preprocedure Evaluation (Signed)
 Anesthesia Evaluation  Patient identified by MRN, date of birth, ID band Patient awake    Reviewed: Allergy & Precautions, NPO status , Patient's Chart, lab work & pertinent test results  Airway Mallampati: II       Dental no notable dental hx. (+) Teeth Intact, Dental Advisory Given   Pulmonary former smoker   Pulmonary exam normal breath sounds clear to auscultation       Cardiovascular hypertension, Pt. on medications Normal cardiovascular exam Rhythm:Regular Rate:Normal     Neuro/Psych  PSYCHIATRIC DISORDERS Anxiety Depression    negative neurological ROS     GI/Hepatic negative GI ROS, Neg liver ROS,,,  Endo/Other  HLD  Renal/GU Renal InsufficiencyRenal diseaseHx/o nephrotic syndrome  negative genitourinary   Musculoskeletal Left calcaneus Fx   Abdominal   Peds  Hematology negative hematology ROS (+)   Anesthesia Other Findings   Reproductive/Obstetrics                              Anesthesia Physical Anesthesia Plan  ASA: 2  Anesthesia Plan: General   Post-op Pain Management: Regional block*, Minimal or no pain anticipated, Precedex and Tylenol PO (pre-op)*   Induction: Intravenous  PONV Risk Score and Plan: 3 and Treatment may vary due to age or medical condition, Propofol infusion and Ondansetron  Airway Management Planned: LMA and Oral ETT  Additional Equipment: None  Intra-op Plan:   Post-operative Plan: Extubation in OR  Informed Consent: I have reviewed the patients History and Physical, chart, labs and discussed the procedure including the risks, benefits and alternatives for the proposed anesthesia with the patient or authorized representative who has indicated his/her understanding and acceptance.     Dental advisory given  Plan Discussed with: CRNA and Anesthesiologist  Anesthesia Plan Comments:          Anesthesia Quick Evaluation

## 2023-12-13 NOTE — H&P (Signed)
 [facility] (OTS) H&P  Patient ID: NYKIRA REDDIX MRN: [id number] DOB/AGE: Nov 24, 1960 63 y.o.  Reason for surgery: Left calcaneus fracture  HPI: Cassandra Pineda is a 63 y.o. female with past medical history significant for anxiety, depression, hypertension, hyperlipidemia presenting for surgery on left lower extremity.  Patient had a fall on 12/10/2023, resulting in a left calcaneus fracture.  She was seen at [facility] on the day of her injury found to have a left calcaneus fracture.  Patient was placed in a short leg splint, appropriate DME, and discharged from the emergency department with instructions to follow-up with orthopedics on outpatient basis.  Patient was seen in the OTS clinic on 12/12/2023.  She presents now for surgical fixation of her left calcaneus.  Denies any previous injury or surgery to the left foot or ankle. Patient ambulates at baseline with no assistive device.  Not on any anticoagulation.  Patient works at [employer].  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 Her smoking use included cigarettes. She has never used smokeless tobacco. She reports that she does not drink alcohol and does not use drugs.  Allergies:  Allergies  Allergen  Reactions   Ace Inhibitors Cough    Medications: Prior to Admission medications   Medication Sig Start Date End Date Taking? Authorizing Provider  acetaminophen (TYLENOL) 500 MG tablet Take 1,000 mg by mouth every 6 (six) hours as needed for mild pain (pain score 1-3) or moderate pain (pain score 4-6).   Yes [provider]  albuterol  (PROAIR  HFA) 108 (90 Base) MCG/ACT inhaler INHALE 2 PUFFS INTO THE LUNGS EVERY SIX HOURS AS NEEDED FOR WHEEZING OR SHORTNESS OF BREATH 08/15/23  Yes Fargo, Amy E, NP  ALPRAZolam  (XANAX ) 0.5 MG tablet Take 1 tablet (0.5 mg total) by mouth every 8 (eight) hours as needed. for anxiety 06/23/23  Yes Lendia Quay A, NP  amLODipine  (NORVASC ) 5 MG tablet Take 5 mg by mouth daily.   Yes [provider]  citalopram  (CELEXA ) 20 MG tablet TAKE 1 TABLET BY MOUTH DAILY 06/22/23  Yes Lendia Quay A, NP  colchicine  0.6 MG tablet TAKE 2 TABLETS BY MOUTH DAILY 11/07/23  Yes Fargo, Amy E, NP  HYDROcodone-acetaminophen (NORCO/VICODIN) 5-325 MG tablet Take 1-2 tablets by mouth every 4 (four) hours as needed. 12/10/23  Yes Hershel Los, MD  ibuprofen (ADVIL) 200 MG tablet Take 600 mg by mouth every 6 (six) hours as needed for moderate pain (pain score 4-6) or mild pain (pain score 1-3).   Yes [provider]  losartan  (COZAAR ) 100 MG tablet Take 1 tablet (100 mg total) by mouth daily. 11/10/23  Yes Fargo, Amy E, NP  Multiple Vitamin (MULTIVITAMIN) tablet Take 1 tablet by mouth daily.   Yes [provider]  rosuvastatin  (CRESTOR ) 10 MG tablet TAKE 1 TABLET BY MOUTH DAILY 02/23/23  Yes Fargo, Amy E, NP   I have reviewed the patient's current medications.  Positive ROS: All other systems have been reviewed and were otherwise negative with the exception of those mentioned in the HPI and as above.  Exam: Height 5\' 3"  (1.6 m), weight 65.3 kg. General: Alert and oriented, no acute distress Cardiovascular: No pedal edema Respiratory: No cyanosis,  no use of accessory musculature GI: No organomegaly, abdomen is soft and non-tender Skin: No lesions in the area of chief complaint Neurologic: Sensation intact distally Psychiatric: Patient is competent for consent with normal mood and affect  Musculoskeletal: Left lower extremity: Well-padded, well-fitting splint in place.  Did not remove splint for exam due to known fracture.  Nontender above splint.  Able to wiggle the toes.  Endorses sensation light touch over the the forefoot.  Toes warm well-perfused.  Right lower extremity: Skin without lesions. No tenderness to palpation. Full painless ROM, full strength in each muscle group without evidence of instability. Motor/sensory function at baseline. Neurovascularly intact.   Medical Decision Making: Data: Imaging: CT scan left ankle performed on 12/10/2023 shows comminuted, displaced fracture of the calcaneus with intra-articular extension  Labs: No results found for this or any previous visit (from the past 24 hours).   Medical history and chart was reviewed and case discussed with attending provider.  Assessment/Plan: 63 year old female status post fall, resulting in left calcaneus fracture.  Patient was significant injury to the left lower extremity which required surgical intervention.  I would recommend proceeding with open reduction internal fixation of the left calcaneus.  Risks and benefits of the procedure were discussed with the patient. Risks discussed included bleeding, infection, malunion, nonunion, damage to surrounding nerves and blood vessels, pain, hardware prominence or irritation, hardware failure, stiffness, post-traumatic arthritis, DVT/PE, compartment syndrome, and even anesthesia complications.  Patient states understanding of these risks and agrees to proceed with surgery.  Consent will be obtained.  Will plan to discharge patient home from the PACU postoperatively.  Patient will be placed into a short leg splint x 2  weeks postoperatively.  Will plan to transition patient to a cam boot at the 2-week mark. She will remain nonweightbearing to the left lower extremity for least the first 6 weeks postoperatively  Edilia Gordon PA-C [facility] [phone] (office) [email or web address]

## 2023-12-14 ENCOUNTER — Ambulatory Visit (HOSPITAL_COMMUNITY)
Admission: RE | Admit: 2023-12-14 | Discharge: 2023-12-14 | Disposition: A | Discharge status: Home / Self Care | Attending: Student | Admitting: Student

## 2023-12-14 ENCOUNTER — Other Ambulatory Visit: Payer: Self-pay

## 2023-12-14 ENCOUNTER — Ambulatory Visit (HOSPITAL_BASED_OUTPATIENT_CLINIC_OR_DEPARTMENT_OTHER): Payer: Self-pay | Admitting: Anesthesiology

## 2023-12-14 ENCOUNTER — Ambulatory Visit (HOSPITAL_COMMUNITY)

## 2023-12-14 ENCOUNTER — Ambulatory Visit (HOSPITAL_COMMUNITY): Payer: Self-pay | Admitting: Anesthesiology

## 2023-12-14 ENCOUNTER — Encounter (HOSPITAL_COMMUNITY): Payer: Self-pay | Admitting: Student

## 2023-12-14 ENCOUNTER — Encounter (HOSPITAL_COMMUNITY)
Admission: RE | Disposition: A | Discharge status: Home / Self Care | Payer: Self-pay | Source: Home / Self Care | Attending: Student

## 2023-12-14 DIAGNOSIS — F418 Other specified anxiety disorders: Secondary | ICD-10-CM

## 2023-12-14 DIAGNOSIS — W19XXXA Unspecified fall, initial encounter: Secondary | ICD-10-CM | POA: Insufficient documentation

## 2023-12-14 DIAGNOSIS — E785 Hyperlipidemia, unspecified: Secondary | ICD-10-CM | POA: Diagnosis not present

## 2023-12-14 DIAGNOSIS — S92002A Unspecified fracture of left calcaneus, initial encounter for closed fracture: Secondary | ICD-10-CM | POA: Diagnosis not present

## 2023-12-14 DIAGNOSIS — I10 Essential (primary) hypertension: Secondary | ICD-10-CM | POA: Insufficient documentation

## 2023-12-14 DIAGNOSIS — S92062A Displaced intraarticular fracture of left calcaneus, initial encounter for closed fracture: Secondary | ICD-10-CM

## 2023-12-14 DIAGNOSIS — Z87891 Personal history of nicotine dependence: Secondary | ICD-10-CM | POA: Diagnosis not present

## 2023-12-14 LAB — CBC
HCT: 38.2 % (ref 36.0–46.0)
Hemoglobin: 11.9 g/dL — ABNORMAL LOW (ref 12.0–15.0)
MCH: 29.1 pg (ref 26.0–34.0)
MCHC: 31.2 g/dL (ref 30.0–36.0)
MCV: 93.4 fL (ref 80.0–100.0)
Platelets: 228 10*3/uL (ref 150–400)
RBC: 4.09 MIL/uL (ref 3.87–5.11)
RDW: 12.3 % (ref 11.5–15.5)
WBC: 6.4 10*3/uL (ref 4.0–10.5)
nRBC: 0 % (ref 0.0–0.2)

## 2023-12-14 LAB — BASIC METABOLIC PANEL WITH GFR
Anion gap: 10 (ref 5–15)
BUN: 21 mg/dL (ref 8–23)
CO2: 23 mmol/L (ref 22–32)
Calcium: 8.4 mg/dL — ABNORMAL LOW (ref 8.9–10.3)
Chloride: 104 mmol/L (ref 98–111)
Creatinine, Ser: 0.92 mg/dL (ref 0.44–1.00)
GFR, Estimated: >60 mL/min (ref >60)
Glucose, Bld: 98 mg/dL (ref 70–99)
Potassium: 4 mmol/L (ref 3.5–5.1)
Sodium: 137 mmol/L (ref 135–145)

## 2023-12-14 SURGERY — OPEN REDUCTION INTERNAL FIXATION (ORIF) CALCANEOUS FRACTURE
Anesthesia: General | Site: Foot | Laterality: Left

## 2023-12-14 MED ORDER — LACTATED RINGERS IV SOLN: INTRAVENOUS | Status: DC

## 2023-12-14 MED ORDER — EPHEDRINE SULFATE-NACL 50-0.9 MG/10ML-% IV SOSY
PREFILLED_SYRINGE | INTRAVENOUS | Status: DC | PRN
  Administered 2023-12-14: 5 mg via INTRAVENOUS

## 2023-12-14 MED ORDER — PROPOFOL 10 MG/ML IV BOLUS
INTRAVENOUS | Status: AC
Start: 2023-12-14 — End: 2023-12-14
  Filled 2023-12-14: qty 20

## 2023-12-14 MED ORDER — PHENYLEPHRINE HCL-NACL 20-0.9 MG/250ML-% IV SOLN
INTRAVENOUS | Status: DC | PRN
  Administered 2023-12-14: 30 ug/min via INTRAVENOUS

## 2023-12-14 MED ORDER — GLYCOPYRROLATE PF 0.2 MG/ML IJ SOSY
PREFILLED_SYRINGE | INTRAMUSCULAR | Status: DC | PRN
  Administered 2023-12-14 (×2): .1 mg via INTRAVENOUS

## 2023-12-14 MED ORDER — PROPOFOL 10 MG/ML IV BOLUS
INTRAVENOUS | Status: DC | PRN
  Administered 2023-12-14: 150 mg via INTRAVENOUS

## 2023-12-14 MED ORDER — CHLORHEXIDINE GLUCONATE 0.12 % MT SOLN
OROMUCOSAL | Status: AC
  Administered 2023-12-14: 15 mL via OROMUCOSAL
  Filled 2023-12-14: qty 15

## 2023-12-14 MED ORDER — HYDROCODONE-ACETAMINOPHEN 5-325 MG PO TABS: 1.0000 | ORAL_TABLET | ORAL | 0 refills | Status: AC | PRN

## 2023-12-14 MED ORDER — MIDAZOLAM HCL 2 MG/2ML IJ SOLN
INTRAMUSCULAR | Status: DC | PRN
  Administered 2023-12-14: 2 mg via INTRAVENOUS

## 2023-12-14 MED ORDER — DEXAMETHASONE SODIUM PHOSPHATE 10 MG/ML IJ SOLN
INTRAMUSCULAR | Status: DC | PRN
  Administered 2023-12-14: 10 mg via INTRAVENOUS

## 2023-12-14 MED ORDER — ORAL CARE MOUTH RINSE: 15.0000 mL | Freq: Once | OROMUCOSAL | Status: AC

## 2023-12-14 MED ORDER — 0.9 % SODIUM CHLORIDE (POUR BTL) OPTIME
TOPICAL | Status: DC | PRN
  Administered 2023-12-14: 1000 mL

## 2023-12-14 MED ORDER — LIDOCAINE 2% (20 MG/ML) 5 ML SYRINGE
INTRAMUSCULAR | Status: DC | PRN
  Administered 2023-12-14: 80 mg via INTRAVENOUS

## 2023-12-14 MED ORDER — CEFAZOLIN SODIUM-DEXTROSE 2-4 GM/100ML-% IV SOLN
2.0000 g | INTRAVENOUS | Status: AC
  Administered 2023-12-14: 2 g via INTRAVENOUS

## 2023-12-14 MED ORDER — BUPIVACAINE LIPOSOME 1.3 % IJ SUSP
INTRAMUSCULAR | Status: DC | PRN
  Administered 2023-12-14: 10 mL via PERINEURAL

## 2023-12-14 MED ORDER — CHLORHEXIDINE GLUCONATE 0.12 % MT SOLN
15.0000 mL | Freq: Once | OROMUCOSAL | Status: AC
Start: 2023-12-14 — End: 2023-12-14

## 2023-12-14 MED ORDER — BUPIVACAINE HCL (PF) 0.5 % IJ SOLN
INTRAMUSCULAR | Status: DC | PRN
  Administered 2023-12-14: 15 mL via PERINEURAL

## 2023-12-14 MED ORDER — MIDAZOLAM HCL 2 MG/2ML IJ SOLN
INTRAMUSCULAR | Status: AC
  Administered 2023-12-14: 1 mg via INTRAVENOUS
  Filled 2023-12-14: qty 2

## 2023-12-14 MED ORDER — CEFAZOLIN SODIUM-DEXTROSE 2-4 GM/100ML-% IV SOLN
INTRAVENOUS | Status: AC
  Filled 2023-12-14: qty 100

## 2023-12-14 MED ORDER — VANCOMYCIN HCL 1000 MG IV SOLR
INTRAVENOUS | Status: AC
  Filled 2023-12-14: qty 20

## 2023-12-14 MED ORDER — MIDAZOLAM HCL 2 MG/2ML IJ SOLN
INTRAMUSCULAR | Status: AC
  Filled 2023-12-14: qty 2

## 2023-12-14 MED ORDER — FENTANYL CITRATE (PF) 100 MCG/2ML IJ SOLN: 50.0000 ug | Freq: Once | INTRAMUSCULAR | Status: AC

## 2023-12-14 MED ORDER — FENTANYL CITRATE (PF) 250 MCG/5ML IJ SOLN
INTRAMUSCULAR | Status: AC
  Filled 2023-12-14: qty 5

## 2023-12-14 MED ORDER — VANCOMYCIN HCL 1000 MG IV SOLR
INTRAVENOUS | Status: DC | PRN
  Administered 2023-12-14: 1000 mg via TOPICAL

## 2023-12-14 MED ORDER — ASPIRIN 325 MG PO TBEC: 325.0000 mg | DELAYED_RELEASE_TABLET | Freq: Every day | ORAL | 0 refills | Status: AC

## 2023-12-14 MED ORDER — FENTANYL CITRATE (PF) 100 MCG/2ML IJ SOLN
INTRAMUSCULAR | Status: AC
  Administered 2023-12-14: 50 ug via INTRAVENOUS
  Filled 2023-12-14: qty 2

## 2023-12-14 MED ORDER — ONDANSETRON HCL 4 MG/2ML IJ SOLN
INTRAMUSCULAR | Status: DC | PRN
  Administered 2023-12-14: 4 mg via INTRAVENOUS

## 2023-12-14 MED ORDER — MIDAZOLAM HCL 2 MG/2ML IJ SOLN: 1.0000 mg | Freq: Once | INTRAMUSCULAR | Status: AC

## 2023-12-14 SURGICAL SUPPLY — 62 items
BAG COUNTER SPONGE SURGICOUNT (BAG) ×1 IMPLANT
BANDAGE ESMARK 6X9 LF (GAUZE/BANDAGES/DRESSINGS) ×1 IMPLANT
BIT DRILL 2.0X130MM (DRILL) IMPLANT
BLADE SURG 10 STRL SS (BLADE) ×1 IMPLANT
BNDG COHESIVE 4X5 TAN STRL LF (GAUZE/BANDAGES/DRESSINGS) ×1 IMPLANT
BNDG ELASTIC 4INX 5YD STR LF (GAUZE/BANDAGES/DRESSINGS) IMPLANT
BNDG ELASTIC 4X5.8 VLCR STR LF (GAUZE/BANDAGES/DRESSINGS) ×1 IMPLANT
BNDG ELASTIC 6INX 5YD STR LF (GAUZE/BANDAGES/DRESSINGS) ×1 IMPLANT
BRUSH SCRUB EZ PLAIN DRY (MISCELLANEOUS) ×2 IMPLANT
CHLORAPREP W/TINT 26 (MISCELLANEOUS) ×1 IMPLANT
CONNECTOR 5 IN 1 STRAIGHT STRL (MISCELLANEOUS) ×1 IMPLANT
COVER MAYO STAND STRL (DRAPES) ×1 IMPLANT
COVER SURGICAL LIGHT HANDLE (MISCELLANEOUS) ×2 IMPLANT
CUFF TOURN SGL QUICK 18X4 (TOURNIQUET CUFF) IMPLANT
DRAPE C-ARM 42X72 X-RAY (DRAPES) ×1 IMPLANT
DRAPE C-ARMOR (DRAPES) ×1 IMPLANT
DRAPE INCISE IOBAN 66X45 STRL (DRAPES) ×1 IMPLANT
DRAPE SURG ORHT 6 SPLT 77X108 (DRAPES) ×2 IMPLANT
DRAPE U-SHAPE 47X51 STRL (DRAPES) ×1 IMPLANT
DRSG EMULSION OIL 3X3 NADH (GAUZE/BANDAGES/DRESSINGS) ×1 IMPLANT
DRSG MEPITEL 4X7.2 (GAUZE/BANDAGES/DRESSINGS) IMPLANT
ELECTRODE REM PT RTRN 9FT ADLT (ELECTROSURGICAL) ×1 IMPLANT
GAUZE PAD ABD 8X10 STRL (GAUZE/BANDAGES/DRESSINGS) IMPLANT
GAUZE SPONGE 4X4 12PLY STRL (GAUZE/BANDAGES/DRESSINGS) ×1 IMPLANT
GLOVE BIO SURGEON STRL SZ 6.5 (GLOVE) ×3 IMPLANT
GLOVE BIO SURGEON STRL SZ7.5 (GLOVE) ×4 IMPLANT
GLOVE BIOGEL PI IND STRL 6.5 (GLOVE) ×1 IMPLANT
GLOVE BIOGEL PI IND STRL 7.5 (GLOVE) ×1 IMPLANT
GOWN STRL REUS W/ TWL LRG LVL3 (GOWN DISPOSABLE) ×2 IMPLANT
KIT BASIN OR (CUSTOM PROCEDURE TRAY) ×1 IMPLANT
KIT TURNOVER KIT B (KITS) ×1 IMPLANT
KWIRE 16 (WIRE) IMPLANT
MANIFOLD NEPTUNE II (INSTRUMENTS) ×1 IMPLANT
NDL HYPO 21X1.5 SAFETY (NEEDLE) IMPLANT
NEEDLE HYPO 21X1.5 SAFETY (NEEDLE) IMPLANT
NS IRRIG 1000ML POUR BTL (IV SOLUTION) ×1 IMPLANT
PACK ORTHO EXTREMITY (CUSTOM PROCEDURE TRAY) ×1 IMPLANT
PAD ARMBOARD POSITIONER FOAM (MISCELLANEOUS) ×2 IMPLANT
PAD CAST 4YDX4 CTTN HI CHSV (CAST SUPPLIES) ×2 IMPLANT
PADDING CAST COTTON 6X4 STRL (CAST SUPPLIES) ×1 IMPLANT
PLATE LOCK VA PERC 2.7X55 LT (Plate) IMPLANT
SCREW LOCK CORT ST 2.7X28 (Screw) IMPLANT
SCREW LOCK CORT ST 2.7X32 (Screw) IMPLANT
SCREW LOCK CORT ST 2.7X34 (Screw) IMPLANT
SCREW NLOCK ST STD 2.7X34 (Screw) IMPLANT
SCREW NLOCK TIB ST 2.7X28 (Screw) IMPLANT
SCREW NLOCK VA 2.7X26 (Screw) IMPLANT
SCREW NLOCK VA 2.7X30 (Screw) IMPLANT
SCREW SHANZ 4.0X60MM (EXFIX) IMPLANT
SPLINT PLASTER CAST FAST 5X30 (CAST SUPPLIES) IMPLANT
SPONGE T-LAP 18X18 ~~LOC~~+RFID (SPONGE) IMPLANT
SUCTION TUBE FRAZIER 10FR DISP (SUCTIONS) ×1 IMPLANT
SUT ETHILON 3 0 PS 1 (SUTURE) ×2 IMPLANT
SUT MON AB 2-0 CT1 36 (SUTURE) IMPLANT
SUT VIC AB 0 CT1 27XBRD ANBCTR (SUTURE) ×1 IMPLANT
SUT VIC AB 2-0 CT1 TAPERPNT 27 (SUTURE) IMPLANT
SYR CONTROL 10ML LL (SYRINGE) IMPLANT
TOWEL GREEN STERILE (TOWEL DISPOSABLE) ×2 IMPLANT
TOWEL GREEN STERILE FF (TOWEL DISPOSABLE) ×1 IMPLANT
TUBE CONNECTING 12X1/4 (SUCTIONS) ×2 IMPLANT
UNDERPAD 30X36 HEAVY ABSORB (UNDERPADS AND DIAPERS) ×1 IMPLANT
WATER STERILE IRR 1000ML POUR (IV SOLUTION) ×1 IMPLANT

## 2023-12-14 NOTE — Op Note (Signed)
 Orthopaedic Surgery Operative Note (CSN: [id number] ) Date of Surgery: 12/14/2023  Admit Date: 12/14/2023   Diagnoses: Pre-Op Diagnoses: Left Sanders II calcaneus fracture  Post-Op Diagnosis: Same  Procedures: CPT 28415-Open reduction internal fixation of left calcaneus fracture  Surgeons : Primary: Laneta Pintos, MD  Assistant: Alona Jamaica, PA-C  Location: OR 3   Anesthesia: General   Antibiotics: Ancef 2g preop with 1 gm vancomycin powder placed topically   Tourniquet time:  Total Tourniquet Time Documented: Calf (Left) - 43 minutes Total: Calf (Left) - 43 minutes  Estimated Blood Loss: 50 mL  Complications:* No complications entered in OR log *   Specimens:* No specimens in log *   Implants: Implant Name Type Inv. Item Serial No. Manufacturer Lot No. LRB No. Used Action  SCREW NLOCK VA 2.7X30 - [id number] Screw SCREW NLOCK VA 2.7X30  [employer]  Left 2 Implanted  SCREW NLOCK TIB ST 2.7X28 - [id number] Screw SCREW NLOCK TIB ST 2.7X28  [employer]  Left 1 Implanted  SCREW NLOCK ST STD 2.7X34 - [id number] Screw SCREW NLOCK ST STD 2.7X34  [employer]  Left 1 Implanted  SCREW LOCK CORT ST 2.7X32 - [id number] Screw SCREW LOCK CORT ST 2.7X32  [employer]  Left 1 Implanted  SCREW LOCK CORT ST 2.7X28 - [id number] Screw SCREW LOCK CORT ST 2.7X28  [employer]  Left 2 Implanted  SCREW NLOCK VA 2.7X26 - [id number] Screw SCREW NLOCK VA 2.7X26  [employer]  Left 1 Implanted  SCREW LOCK CORT ST 2.7X34 - [id number] Screw SCREW LOCK CORT ST 2.7X34  [employer]  Left 1 Implanted     Indications for Surgery: Cassandra Pineda who sustained a left calcaneus fracture.  Due to the unstable nature of her injury I recommend proceeding with open reduction internal fixation.  Risks and benefits were discussed with the patient and her daughter.  Risks include but not  limited to bleeding, infection, malunion, nonunion, hardware failure, hardware rotation, nerve and blood vessel injury, DVT, even the possibility anesthetic complications.  She agreed to proceed with surgery and consent was obtained.  Operative Findings: 1.  Open reduction internal fixation of left intra-articular calcaneus fracture through sinus tarsi approach using [employer] EVOS small calcaneal plate  Procedure: The patient was identified in the preoperative holding area. Consent was confirmed with the patient and their family and all questions were answered. The operative extremity was marked after confirmation with the patient. she was then brought back to the operating room by our anesthesia colleagues.  She was carefully transferred over to a radiolucent flattop table.  She was placed under general anesthetic.  A bump was placed under the left hip.  The left lower extremity was then prepped and draped in usual sterile fashion.  A timeout was performed to verify the patient, the procedure, and the extremity.  Preoperative antibiotics were dosed.  Fluoroscopic imaging showed the unstable nature of her injury.  Identified the appropriate starting point for a sinus tarsi approach.  Carried it down through skin subcutaneous tissue.  I identified the peroneal tendons and protected these throughout the case.  I then incised through the retinaculum to visualize the articular surface.  I was able to see the intra-articular fracture and used a Cobb elevator to elevate this back up.  I used fluoroscopic imaging as well as IntraOp visualization to identify the anatomic reduction of the posterior facet of the  calcaneus.  Once I was pleased with the reduction I then held it provisionally with 1.6 mm K wires.  I then drilled and placed a 2.7 mm nonlocking screw to hold the articular surface to remove the K wires.  I did not develop the path underneath the peroneal tendons along the lateral wall of the calcaneus  for plate fixation.  I then chose a small [employer] Nephew calcaneal plate and slid this subcutaneously along the lateral wall of the calcaneus.  I then drilled and placed a nonlocking screw just inferior to the posterior facet.  I then percutaneously placed a nonlocking screw into the tuberosity to bring the plate flush to bone.  Locking screws were placed into the anterior process as well as into the main body as well as the tuberosity.  Final fluoroscopic imaging was obtained showing adequate position of the plate and adequate alignment of the fracture.  The incisions were irrigated.  A gram of vancomycin powder was placed into the incision.  A layered closure of 0 Vicryl, 2-0 Monocryl and 3-0 nylon.  Sterile dressings were applied.  A well-padded short leg splint was then placed after the tourniquet was deflated.  The patient was then awoke from anesthesia and taken to the PACU in stable condition.   Post Op Plan/Instructions: Patient will be nonweightbearing to the left lower extremity.  She will receive no surgical prophylaxis she will be placed on aspirin 81 mg for DVT prophylaxis.  Will discharge home from the PACU and follow-up in approximately 2 weeks for repeat x-rays.  I was present and performed the entire surgery.  Alona Jamaica, PA-C did assist me throughout the case. An assistant was necessary given the difficulty in approach, maintenance of reduction and ability to instrument the fracture.   Katheryne Pane, MD [facility]

## 2023-12-14 NOTE — Anesthesia Procedure Notes (Signed)
 Anesthesia Regional Block: Popliteal block   Pre-Anesthetic Checklist: , timeout performed,  Correct Patient, Correct Site, Correct Laterality,  Correct Procedure, Correct Position, site marked,  Risks and benefits discussed,  Surgical consent,  Pre-op evaluation,  At surgeon's request and post-op pain management  Laterality: Left  Prep: chloraprep       Needles:  Injection technique: Single-shot  Needle Type: Echogenic Stimulator Needle     Needle Length: 10cm  Needle Gauge: 21   Needle insertion depth: 6 cm   Additional Needles:   Procedures:,,,, ultrasound used (permanent image in chart),,    Narrative:  Start time: 12/14/2023 10:27 AM End time: 12/14/2023 10:32 AM Injection made incrementally with aspirations every 5 mL.  Performed by: Personally  Anesthesiologist: Tura Gaines, MD  Additional Notes: Timeout performed. Patient sedated. Relevant anatomy ID'd using US . Incremental 2-5ml injection of LA with frequent aspiration. Patient tolerated procedure well.

## 2023-12-14 NOTE — Transfer of Care (Signed)
 Immediate Anesthesia Transfer of Care Note  Patient: Cassandra Pineda  Procedure(s) Performed: OPEN REDUCTION INTERNAL FIXATION (ORIF) CALCANEOUS FRACTURE, LEFT (Left: Foot)  Patient Location: PACU  Anesthesia Type:General  Level of Consciousness: drowsy and patient cooperative  Airway & Oxygen Therapy: Patient Spontanous Breathing  Post-op Assessment: Report given to RN, Post -op Vital signs reviewed and stable, and Patient moving all extremities X 4  Post vital signs: Reviewed and stable  Last Vitals:  Vitals Value Taken Time  BP 139/56 12/14/23 1237  Temp    Pulse 84 12/14/23 1240  Resp 12 12/14/23 1240  SpO2 92 % 12/14/23 1240  Vitals shown include unfiled device data.  Last Pain:  Vitals:   12/14/23 0844  TempSrc:   PainSc: 3          Complications: No notable events documented.

## 2023-12-14 NOTE — Discharge Instructions (Addendum)
 Cassandra Pane, MD Alona Jamaica PA-C [facility] [address] [phone] (tel)   [phone] (fax)                                  POST-OPERATIVE INSTRUCTIONS     WEIGHT BEARING STATUS: Non-weightbearing left leg  RANGE OF MOTION/ACTIVITY: Maintain splint. Ok for knee motion  WOUND CARE Please keep splint clean dry and intact until follow-up. If your splint gets wet for any reason please contact the office immediately.  Do not stick anything down your splint such as pencils, momey, hangers to try and scratch yourself.  If you feel itchy take Benadryl as prescribed on the bottle for itching You may shower on Post-Op Day #2.  You must keep splint dry during this process and may find that a plastic bag taped around the extremity or alternatively a towel based bath may be a better option.   If you get your splint wet or if it is damaged please contact our clinic.  EXERCISES Due to your splint being in place you will not be able to bear weight through your extremity.   DO NOT PUT ANY WEIGHT ON YOUR OPERATIVE LEG Please use crutches or a walker to avoid weight bearing.   DVT/PE prophylaxis: Aspirin  DIET: As you were eating previously.  Can use over the counter stool softeners and bowel preparations, such as Miralax, to help with bowel movements.  Narcotics can be constipating.  Be sure to drink plenty of fluids  REGIONAL ANESTHESIA (NERVE BLOCKS) The anesthesia team may have performed a nerve block for you if safe in the setting of your care.  This is a great tool used to minimize pain.  Typically the block may start wearing off overnight but the long acting medicine may last for 3-4 days.  The nerve block wearing off can be a challenging period but please utilize your as needed pain medications to try and manage this period.    POST-OP MEDICATIONS- Multimodal approach to pain control  In general your pain will be controlled with a combination of  substances.  Prescriptions unless otherwise discussed are electronically sent to your pharmacy.  This is a carefully made plan we use to minimize narcotic use.     - Meloxicam OR Celebrex - Anti-inflammatory medication taken on a scheduled basis  - Acetaminophen - Non-narcotic pain medicine taken on a scheduled basis   - Hydrocodone - This is a strong narcotic, to be used only on an as needed basis for pain.  -  Aspirin 325mg  - This medicine is used to minimize the risk of blood clots after surgery.  FOLLOW-UP If you develop a Fever (>101.5), Redness or Drainage from the surgical incision site, please call our office to arrange for an evaluation. Please call the office to schedule a follow-up appointment for your incision check if you do not already have one, 7-10 days post-operatively.   VISIT OUR WEBSITE FOR ADDITIONAL INFORMATION: [email or web address]   HELPFUL INFORMATION  If you had a block, it will wear off between 8-24 hrs postop typically.  This is period when your pain may go from nearly zero to the pain you would have had postop without the block.  This is an abrupt transition but nothing dangerous is happening.  You may take an extra dose of narcotic when this happens.  You should wean off your narcotic medicines as soon as you  are able.  Most patients will be off or using minimal narcotics before their first postop appointment.   We suggest you use the pain medication the first night prior to going to bed, in order to ease any pain when the anesthesia wears off. You should avoid taking pain medications on an empty stomach as it will make you nauseous.  Do not drink alcoholic beverages or take illicit drugs when taking pain medications.  In most states it is against the law to drive while you are in a splint or sling.  And certainly against the law to drive while taking narcotics.  You may return to work/school in the next couple of days when you feel up to it.   Pain  medication may make you constipated.  Below are a few solutions to try in this order: Decrease the amount of pain medication if you aren't having pain. Drink lots of decaffeinated fluids. Drink prune juice and/or each dried prunes  If the first 3 don't work start with additional solutions Take Colace - an over-the-counter stool softener Take Senokot - an over-the-counter laxative Take Miralax - a stronger over-the-counter laxative

## 2023-12-14 NOTE — Interval H&P Note (Signed)
 History and Physical Interval Note:  12/14/2023 10:14 AM  Cassandra Pineda  has presented today for surgery, with the diagnosis of Left calcaneus fracture.  The various methods of treatment have been discussed with the patient and family. After consideration of risks, benefits and other options for treatment, the patient has consented to  Procedure(s): OPEN REDUCTION INTERNAL FIXATION (ORIF) CALCANEOUS FRACTURE (Left) as a surgical intervention.  The patient's history has been reviewed, patient examined, no change in status, stable for surgery.  I have reviewed the patient's chart and labs.  Questions were answered to the patient's satisfaction.     Ronna Herskowitz P Derron Pipkins

## 2023-12-14 NOTE — Anesthesia Postprocedure Evaluation (Signed)
 Anesthesia Post Note  Patient: Cassandra Pineda  Procedure(s) Performed: OPEN REDUCTION INTERNAL FIXATION (ORIF) CALCANEOUS FRACTURE, LEFT (Left: Foot)     Patient location during evaluation: PACU Anesthesia Type: General Level of consciousness: awake and alert and oriented Pain management: pain level controlled Vital Signs Assessment: post-procedure vital signs reviewed and stable Respiratory status: spontaneous breathing, nonlabored ventilation and respiratory function stable Cardiovascular status: blood pressure returned to baseline and stable Postop Assessment: no apparent nausea or vomiting Anesthetic complications: no   No notable events documented.  Last Vitals:  Vitals:   12/14/23 1300 12/14/23 1307  BP: 131/63 (!) 142/68  Pulse: 64 62  Resp: 16 12  Temp:  36.6 C  SpO2: 92% 93%    Last Pain:  Vitals:   12/14/23 1307  TempSrc:   PainSc: 0-No pain                 Jami Bogdanski A.

## 2023-12-14 NOTE — Anesthesia Procedure Notes (Addendum)
 Procedure Name: LMA Insertion Date/Time: 12/14/2023 11:12 AM  Performed by: Keitha Pata, CRNAPre-anesthesia Checklist: Patient identified, Emergency Drugs available, Suction available and Patient being monitored Patient Re-evaluated:Patient Re-evaluated prior to induction Oxygen Delivery Method: Circle system utilized Preoxygenation: Pre-oxygenation with 100% oxygen Induction Type: IV induction Ventilation: Mask ventilation without difficulty LMA: LMA inserted LMA Size: 4.0 Number of attempts: 1 Placement Confirmation: positive ETCO2, breath sounds checked- equal and bilateral and CO2 detector Tube secured with: Tape Dental Injury: Teeth and Oropharynx as per pre-operative assessment and Injury to lip  Comments: Patient with existing lip ulcer that began bleeding after LMA placement. Lubrication applied for hydration.

## 2023-12-16 ENCOUNTER — Encounter (HOSPITAL_COMMUNITY): Payer: Self-pay | Admitting: Student

## 2023-12-20 ENCOUNTER — Encounter (HOSPITAL_COMMUNITY): Payer: Self-pay | Admitting: Student

## 2024-03-07 ENCOUNTER — Other Ambulatory Visit: Payer: Self-pay | Admitting: Orthopedic Surgery

## 2024-03-07 DIAGNOSIS — E782 Mixed hyperlipidemia: Secondary | ICD-10-CM

## 2024-03-09 ENCOUNTER — Other Ambulatory Visit: Payer: Self-pay | Admitting: Orthopedic Surgery

## 2024-03-20 ENCOUNTER — Other Ambulatory Visit: Payer: Self-pay | Admitting: Orthopedic Surgery

## 2024-03-20 ENCOUNTER — Other Ambulatory Visit (HOSPITAL_COMMUNITY): Payer: Self-pay | Admitting: Adult Health

## 2024-03-20 ENCOUNTER — Telehealth: Payer: Self-pay

## 2024-03-20 DIAGNOSIS — I129 Hypertensive chronic kidney disease with stage 1 through stage 4 chronic kidney disease, or unspecified chronic kidney disease: Secondary | ICD-10-CM

## 2024-03-20 DIAGNOSIS — Z1231 Encounter for screening mammogram for malignant neoplasm of breast: Secondary | ICD-10-CM

## 2024-03-20 DIAGNOSIS — E782 Mixed hyperlipidemia: Secondary | ICD-10-CM

## 2024-03-20 DIAGNOSIS — R7303 Prediabetes: Secondary | ICD-10-CM

## 2024-03-20 NOTE — Telephone Encounter (Signed)
 Orders placed. She will need to come fasting for lab visit.

## 2024-03-20 NOTE — Telephone Encounter (Signed)
 Copied from CRM [id number]. Topic: Clinical - Request for Lab/Test Order >> Mar 20, 2024  1:36 PM Farrel B wrote: Reason for CRM: patient is requesting that she has labs scheduled a week before her February 4th appoint, she has requested that it's first thing in the morning so that she can have it taken care of before her physical the following week

## 2024-03-27 ENCOUNTER — Other Ambulatory Visit: Payer: Self-pay | Admitting: Orthopedic Surgery

## 2024-03-27 DIAGNOSIS — B372 Candidiasis of skin and nail: Secondary | ICD-10-CM

## 2024-03-29 ENCOUNTER — Encounter: Admitting: Orthopedic Surgery

## 2024-04-19 ENCOUNTER — Other Ambulatory Visit: Payer: Self-pay | Admitting: Adult Health

## 2024-05-17 ENCOUNTER — Other Ambulatory Visit: Payer: Self-pay | Admitting: Orthopedic Surgery

## 2024-06-29 ENCOUNTER — Other Ambulatory Visit: Payer: Self-pay | Admitting: Adult Health

## 2024-08-09 ENCOUNTER — Encounter: Admitting: Orthopedic Surgery

## 2024-08-23 ENCOUNTER — Other Ambulatory Visit: Payer: Self-pay

## 2024-08-30 ENCOUNTER — Encounter: Payer: Self-pay | Admitting: Orthopedic Surgery

## 2024-08-30 ENCOUNTER — Ambulatory Visit: Admitting: Adult Health

## 2024-08-31 ENCOUNTER — Ambulatory Visit (HOSPITAL_COMMUNITY)
